# Patient Record
Sex: Female | Born: 1940 | Hispanic: No | Smoking: Never smoker
Health system: Southern US, Community
[De-identification: ages and names within clinical notes are randomized; demographics above are authoritative.]

## PROBLEM LIST (undated history)

## (undated) DIAGNOSIS — I499 Cardiac arrhythmia, unspecified: Secondary | ICD-10-CM

## (undated) DIAGNOSIS — M1990 Unspecified osteoarthritis, unspecified site: Secondary | ICD-10-CM

## (undated) DIAGNOSIS — I4891 Unspecified atrial fibrillation: Secondary | ICD-10-CM

## (undated) DIAGNOSIS — R51 Headache: Secondary | ICD-10-CM

## (undated) HISTORY — DX: Unspecified atrial fibrillation: I48.91

## (undated) HISTORY — DX: Headache: R51

## (undated) HISTORY — PX: CATARACT EXTRACTION, BILATERAL: SHX1313

## (undated) HISTORY — PX: BREAST BIOPSY: SHX20

## (undated) HISTORY — PX: OTHER SURGICAL HISTORY: SHX169

---

## 1997-06-27 ENCOUNTER — Other Ambulatory Visit: Admission: RE | Admit: 1997-06-27 | Discharge: 1997-06-27 | Payer: Self-pay | Admitting: *Deleted

## 1998-07-23 ENCOUNTER — Other Ambulatory Visit: Admission: RE | Admit: 1998-07-23 | Discharge: 1998-07-23 | Payer: Self-pay | Admitting: *Deleted

## 1999-07-30 ENCOUNTER — Other Ambulatory Visit: Admission: RE | Admit: 1999-07-30 | Discharge: 1999-07-30 | Payer: Self-pay | Admitting: *Deleted

## 2000-08-07 ENCOUNTER — Other Ambulatory Visit: Admission: RE | Admit: 2000-08-07 | Discharge: 2000-08-07 | Payer: Self-pay | Admitting: *Deleted

## 2005-02-10 ENCOUNTER — Ambulatory Visit: Payer: Self-pay | Admitting: Internal Medicine

## 2005-02-10 ENCOUNTER — Ambulatory Visit (HOSPITAL_COMMUNITY): Admission: RE | Admit: 2005-02-10 | Discharge: 2005-02-10 | Payer: Self-pay | Admitting: Internal Medicine

## 2006-01-27 ENCOUNTER — Encounter: Admission: RE | Admit: 2006-01-27 | Discharge: 2006-01-27 | Payer: Self-pay | Admitting: Internal Medicine

## 2007-01-29 ENCOUNTER — Encounter: Admission: RE | Admit: 2007-01-29 | Discharge: 2007-01-29 | Payer: Self-pay | Admitting: Internal Medicine

## 2008-02-12 ENCOUNTER — Encounter: Admission: RE | Admit: 2008-02-12 | Discharge: 2008-02-12 | Payer: Self-pay | Admitting: Unknown Physician Specialty

## 2010-02-11 ENCOUNTER — Ambulatory Visit: Admit: 2010-02-11 | Payer: Self-pay | Admitting: Gastroenterology

## 2010-02-23 ENCOUNTER — Encounter (INDEPENDENT_AMBULATORY_CARE_PROVIDER_SITE_OTHER): Payer: Self-pay

## 2010-03-18 NOTE — Letter (Signed)
Summary: Recall, Screening Colonoscopy Only  [facility]  [address]   [place], [place] [address]   Phone: [phone]  Fax: [phone]    February 23, 2010  SEPHIRA ZELLMAN [address] [place], [place]  [address] 01/29/1941   Dear Ms. Mayford Knife,   Our records indicate it is time to schedule your colonoscopy.   Please call our office at [phone] and ask for the nurse.   Thank you,  Hendricks Limes, LPN Cloria Spring, LPN  [facility] Ph: [phone]   Fax: [phone]

## 2010-07-02 NOTE — Op Note (Signed)
NAMEKARIS, RILLING                  ACCOUNT NO.:  [id number]   MEDICAL RECORD NO.:  [id number]          PATIENT TYPE:  AMB   LOCATION:  DAY                           FACILITY:  [facility]   PHYSICIAN:  Lionel December, M.D.    DATE OF BIRTH:  1941-02-04   DATE OF PROCEDURE:  02/10/2005  DATE OF DISCHARGE:                                 OPERATIVE REPORT   PROCEDURE:  Colonoscopy.   INDICATION:  Sherry Smith is a 70 year old Caucasian female who is here for high-risk  screening colonoscopy. Her mother was treated for colon carcinoma in her 59s  and remains in remission. Procedure risks were reviewed with the patient,  informed consent was obtained.   MEDICINES FOR CONSCIOUS SEDATION:  Demerol 25 mg IV, Versed 5 mg IV.   FINDINGS:  Procedure performed in endoscopy suite. The patient's vital signs  and O2 sat were monitored during procedure and remained stable. The patient  was placed in left lateral recumbent position and rectal examination  performed. No abnormality noted on external or digital exam. Olympus  videoscope was placed in rectum and advanced under vision into sigmoid colon  and beyond. Preparation was excellent. Somewhat redundant colon but the  scope was passed into cecum which was identified by appendiceal orifice and  ileocecal valve. Pictures taken for the record. As the scope was withdrawn,  colonic mucosa was carefully examined. Some areas revealed fine punctate  pigmentation consistent with melanosis coli. No polyps and/or tumor masses  were noted. Rectal mucosa similarly was normal. Scope was retroflexed to  examine anorectal junction and moderate-sized hemorrhoids were noted below  the dentate line. Endoscope was straightened and withdrawn. The patient  tolerated the procedure well.   FINAL DIAGNOSIS:  External hemorrhoids, otherwise normal colonoscopy.   RECOMMENDATIONS:  1.  Yearly Hemoccults.  2.  High-fiber diet.  3.  She may consider next screening exam in 5 years from  now.      Lionel December, M.D.  Electronically Signed     NR/MEDQ  D:  02/10/2005  T:  02/10/2005  Job:  [id number]   cc:   Ernestina Penna  Fax: [phone]   Dhruv Vyas  Fax: [phone]

## 2012-01-09 ENCOUNTER — Encounter (INDEPENDENT_AMBULATORY_CARE_PROVIDER_SITE_OTHER): Payer: Self-pay | Admitting: *Deleted

## 2015-05-04 DIAGNOSIS — Z1231 Encounter for screening mammogram for malignant neoplasm of breast: Secondary | ICD-10-CM | POA: Diagnosis not present

## 2015-05-07 DIAGNOSIS — H538 Other visual disturbances: Secondary | ICD-10-CM | POA: Diagnosis not present

## 2015-05-07 DIAGNOSIS — H2513 Age-related nuclear cataract, bilateral: Secondary | ICD-10-CM | POA: Diagnosis not present

## 2015-05-28 DIAGNOSIS — Z0000 Encounter for general adult medical examination without abnormal findings: Secondary | ICD-10-CM | POA: Diagnosis not present

## 2015-05-28 DIAGNOSIS — E7800 Pure hypercholesterolemia, unspecified: Secondary | ICD-10-CM | POA: Diagnosis not present

## 2015-05-28 DIAGNOSIS — Z7189 Other specified counseling: Secondary | ICD-10-CM | POA: Diagnosis not present

## 2015-05-28 DIAGNOSIS — Z299 Encounter for prophylactic measures, unspecified: Secondary | ICD-10-CM | POA: Diagnosis not present

## 2015-05-28 DIAGNOSIS — Z1389 Encounter for screening for other disorder: Secondary | ICD-10-CM | POA: Diagnosis not present

## 2015-05-28 DIAGNOSIS — Z6824 Body mass index (BMI) 24.0-24.9, adult: Secondary | ICD-10-CM | POA: Diagnosis not present

## 2015-05-28 DIAGNOSIS — Z1211 Encounter for screening for malignant neoplasm of colon: Secondary | ICD-10-CM | POA: Diagnosis not present

## 2015-05-28 DIAGNOSIS — R5383 Other fatigue: Secondary | ICD-10-CM | POA: Diagnosis not present

## 2015-05-28 DIAGNOSIS — Z79899 Other long term (current) drug therapy: Secondary | ICD-10-CM | POA: Diagnosis not present

## 2015-12-02 DIAGNOSIS — Z6824 Body mass index (BMI) 24.0-24.9, adult: Secondary | ICD-10-CM | POA: Diagnosis not present

## 2015-12-02 DIAGNOSIS — Z01419 Encounter for gynecological examination (general) (routine) without abnormal findings: Secondary | ICD-10-CM | POA: Diagnosis not present

## 2015-12-02 DIAGNOSIS — G43009 Migraine without aura, not intractable, without status migrainosus: Secondary | ICD-10-CM | POA: Diagnosis not present

## 2015-12-14 DIAGNOSIS — Z713 Dietary counseling and surveillance: Secondary | ICD-10-CM | POA: Diagnosis not present

## 2015-12-14 DIAGNOSIS — M1990 Unspecified osteoarthritis, unspecified site: Secondary | ICD-10-CM | POA: Diagnosis not present

## 2015-12-14 DIAGNOSIS — Z299 Encounter for prophylactic measures, unspecified: Secondary | ICD-10-CM | POA: Diagnosis not present

## 2015-12-14 DIAGNOSIS — Z6824 Body mass index (BMI) 24.0-24.9, adult: Secondary | ICD-10-CM | POA: Diagnosis not present

## 2015-12-28 DIAGNOSIS — Z23 Encounter for immunization: Secondary | ICD-10-CM | POA: Diagnosis not present

## 2016-04-01 DIAGNOSIS — Z6823 Body mass index (BMI) 23.0-23.9, adult: Secondary | ICD-10-CM | POA: Diagnosis not present

## 2016-04-01 DIAGNOSIS — R300 Dysuria: Secondary | ICD-10-CM | POA: Diagnosis not present

## 2016-04-01 DIAGNOSIS — N390 Urinary tract infection, site not specified: Secondary | ICD-10-CM | POA: Diagnosis not present

## 2016-04-01 DIAGNOSIS — Z789 Other specified health status: Secondary | ICD-10-CM | POA: Diagnosis not present

## 2016-04-01 DIAGNOSIS — Z713 Dietary counseling and surveillance: Secondary | ICD-10-CM | POA: Diagnosis not present

## 2016-04-01 DIAGNOSIS — Z299 Encounter for prophylactic measures, unspecified: Secondary | ICD-10-CM | POA: Diagnosis not present

## 2016-05-05 DIAGNOSIS — Z1231 Encounter for screening mammogram for malignant neoplasm of breast: Secondary | ICD-10-CM | POA: Diagnosis not present

## 2016-05-10 DIAGNOSIS — D485 Neoplasm of uncertain behavior of skin: Secondary | ICD-10-CM | POA: Diagnosis not present

## 2016-05-10 DIAGNOSIS — L821 Other seborrheic keratosis: Secondary | ICD-10-CM | POA: Diagnosis not present

## 2016-05-10 DIAGNOSIS — L820 Inflamed seborrheic keratosis: Secondary | ICD-10-CM | POA: Diagnosis not present

## 2016-05-10 DIAGNOSIS — L570 Actinic keratosis: Secondary | ICD-10-CM | POA: Diagnosis not present

## 2016-05-16 DIAGNOSIS — N390 Urinary tract infection, site not specified: Secondary | ICD-10-CM | POA: Diagnosis not present

## 2016-05-16 DIAGNOSIS — R109 Unspecified abdominal pain: Secondary | ICD-10-CM | POA: Diagnosis not present

## 2016-05-16 DIAGNOSIS — Z6824 Body mass index (BMI) 24.0-24.9, adult: Secondary | ICD-10-CM | POA: Diagnosis not present

## 2016-05-16 DIAGNOSIS — Z299 Encounter for prophylactic measures, unspecified: Secondary | ICD-10-CM | POA: Diagnosis not present

## 2016-05-16 DIAGNOSIS — Z713 Dietary counseling and surveillance: Secondary | ICD-10-CM | POA: Diagnosis not present

## 2016-06-02 DIAGNOSIS — Z1211 Encounter for screening for malignant neoplasm of colon: Secondary | ICD-10-CM | POA: Diagnosis not present

## 2016-06-02 DIAGNOSIS — R32 Unspecified urinary incontinence: Secondary | ICD-10-CM | POA: Diagnosis not present

## 2016-06-02 DIAGNOSIS — R5383 Other fatigue: Secondary | ICD-10-CM | POA: Diagnosis not present

## 2016-06-02 DIAGNOSIS — Z7189 Other specified counseling: Secondary | ICD-10-CM | POA: Diagnosis not present

## 2016-06-02 DIAGNOSIS — Z0000 Encounter for general adult medical examination without abnormal findings: Secondary | ICD-10-CM | POA: Diagnosis not present

## 2016-06-02 DIAGNOSIS — Z6824 Body mass index (BMI) 24.0-24.9, adult: Secondary | ICD-10-CM | POA: Diagnosis not present

## 2016-06-02 DIAGNOSIS — Z299 Encounter for prophylactic measures, unspecified: Secondary | ICD-10-CM | POA: Diagnosis not present

## 2016-06-02 DIAGNOSIS — E7800 Pure hypercholesterolemia, unspecified: Secondary | ICD-10-CM | POA: Diagnosis not present

## 2016-06-02 DIAGNOSIS — M8580 Other specified disorders of bone density and structure, unspecified site: Secondary | ICD-10-CM | POA: Diagnosis not present

## 2016-06-02 DIAGNOSIS — Z1389 Encounter for screening for other disorder: Secondary | ICD-10-CM | POA: Diagnosis not present

## 2016-06-02 DIAGNOSIS — Z79899 Other long term (current) drug therapy: Secondary | ICD-10-CM | POA: Diagnosis not present

## 2016-07-15 DIAGNOSIS — H52223 Regular astigmatism, bilateral: Secondary | ICD-10-CM | POA: Diagnosis not present

## 2016-07-15 DIAGNOSIS — H524 Presbyopia: Secondary | ICD-10-CM | POA: Diagnosis not present

## 2016-07-15 DIAGNOSIS — H2513 Age-related nuclear cataract, bilateral: Secondary | ICD-10-CM | POA: Diagnosis not present

## 2016-07-15 DIAGNOSIS — H5203 Hypermetropia, bilateral: Secondary | ICD-10-CM | POA: Diagnosis not present

## 2016-07-20 DIAGNOSIS — Z6824 Body mass index (BMI) 24.0-24.9, adult: Secondary | ICD-10-CM | POA: Diagnosis not present

## 2016-07-20 DIAGNOSIS — M1990 Unspecified osteoarthritis, unspecified site: Secondary | ICD-10-CM | POA: Diagnosis not present

## 2016-07-20 DIAGNOSIS — E7800 Pure hypercholesterolemia, unspecified: Secondary | ICD-10-CM | POA: Diagnosis not present

## 2016-07-20 DIAGNOSIS — R32 Unspecified urinary incontinence: Secondary | ICD-10-CM | POA: Diagnosis not present

## 2016-07-20 DIAGNOSIS — Z299 Encounter for prophylactic measures, unspecified: Secondary | ICD-10-CM | POA: Diagnosis not present

## 2016-10-20 DIAGNOSIS — R350 Frequency of micturition: Secondary | ICD-10-CM | POA: Diagnosis not present

## 2016-10-20 DIAGNOSIS — Z6824 Body mass index (BMI) 24.0-24.9, adult: Secondary | ICD-10-CM | POA: Diagnosis not present

## 2016-10-20 DIAGNOSIS — E7800 Pure hypercholesterolemia, unspecified: Secondary | ICD-10-CM | POA: Diagnosis not present

## 2016-10-20 DIAGNOSIS — Z299 Encounter for prophylactic measures, unspecified: Secondary | ICD-10-CM | POA: Diagnosis not present

## 2016-10-20 DIAGNOSIS — Z713 Dietary counseling and surveillance: Secondary | ICD-10-CM | POA: Diagnosis not present

## 2016-11-18 DIAGNOSIS — M8580 Other specified disorders of bone density and structure, unspecified site: Secondary | ICD-10-CM | POA: Diagnosis not present

## 2016-11-18 DIAGNOSIS — Z6824 Body mass index (BMI) 24.0-24.9, adult: Secondary | ICD-10-CM | POA: Diagnosis not present

## 2016-11-18 DIAGNOSIS — R1310 Dysphagia, unspecified: Secondary | ICD-10-CM | POA: Diagnosis not present

## 2016-11-18 DIAGNOSIS — Z299 Encounter for prophylactic measures, unspecified: Secondary | ICD-10-CM | POA: Diagnosis not present

## 2016-11-18 DIAGNOSIS — E7800 Pure hypercholesterolemia, unspecified: Secondary | ICD-10-CM | POA: Diagnosis not present

## 2016-11-23 ENCOUNTER — Encounter (INDEPENDENT_AMBULATORY_CARE_PROVIDER_SITE_OTHER): Payer: Self-pay | Admitting: Internal Medicine

## 2016-11-29 ENCOUNTER — Encounter (INDEPENDENT_AMBULATORY_CARE_PROVIDER_SITE_OTHER): Payer: Self-pay

## 2016-11-29 ENCOUNTER — Encounter (INDEPENDENT_AMBULATORY_CARE_PROVIDER_SITE_OTHER): Payer: Self-pay | Admitting: Internal Medicine

## 2016-11-29 ENCOUNTER — Ambulatory Visit (INDEPENDENT_AMBULATORY_CARE_PROVIDER_SITE_OTHER): Payer: Medicare Other | Admitting: Internal Medicine

## 2016-11-29 ENCOUNTER — Encounter (INDEPENDENT_AMBULATORY_CARE_PROVIDER_SITE_OTHER): Payer: Self-pay | Admitting: *Deleted

## 2016-11-29 VITALS — BP 150/72 | HR 64 | Temp 98.2°F | Ht 65.0 in | Wt 139.1 lb

## 2016-11-29 DIAGNOSIS — R51 Headache: Secondary | ICD-10-CM

## 2016-11-29 DIAGNOSIS — R519 Headache, unspecified: Secondary | ICD-10-CM

## 2016-11-29 DIAGNOSIS — G8929 Other chronic pain: Secondary | ICD-10-CM

## 2016-11-29 DIAGNOSIS — R1319 Other dysphagia: Secondary | ICD-10-CM

## 2016-11-29 DIAGNOSIS — R1310 Dysphagia, unspecified: Secondary | ICD-10-CM

## 2016-11-29 HISTORY — DX: Headache, unspecified: R51.9

## 2016-11-29 HISTORY — DX: Other chronic pain: G89.29

## 2016-11-29 NOTE — Patient Instructions (Signed)
DG esophagram.   

## 2016-11-29 NOTE — Progress Notes (Signed)
   Subjective:    Patient ID: Sherry Smith, female    DOB: Jun 29, 1940, 76 y.o.   MRN: [id number]  HPI  Referred by Dr. Woody Seller for dysphagia. Presents today with c/o dysphagia. She has had 4 episodes of dysphagia.  There was no pain. She was unable to swallow her saliva. Last episode was in October.   Her appetite is good. No weight loss.  She has never had an EGD/'ED in the past.  She had a colonoscopy about 10 yrs ago by kDR. Rehman and it was nromal. 06/06/2016 H and H 13.7 and 38.9 Total bili 0.2, ALP 87. AST 16, ALT 9  Review of Systems Past Medical History:  Diagnosis Date  . Chronic headaches 11/29/2016    Past Surgical History:  Procedure Laterality Date  . Ruptured ovary     left ovary in the 60s    Allergies  Allergen Reactions  . Sulfa Antibiotics     Headaches.  . Tetracyclines & Related     Yeast infection    No current outpatient prescriptions on file prior to visit.   No current facility-administered medications on file prior to visit.    Current Outpatient Prescriptions  Medication Sig Dispense Refill  . butalbital-acetaminophen-caffeine (FIORICET, ESGIC) 50-325-40 MG tablet Take by mouth 4 (four) times daily.    . celecoxib (CELEBREX) 100 MG capsule Take 100 mg by mouth as needed.    . pantoprazole (PROTONIX) 40 MG tablet Take by mouth daily.    . ranitidine (ZANTAC) 150 MG capsule Take 150 mg by mouth every morning.     No current facility-administered medications for this visit.         Objective:   Physical Exam Blood pressure (!) 150/72, pulse 64, temperature 98.2 F (36.8 C), height 5\' 5"  (1.651 m), weight 139 lb 1.6 oz (63.1 kg). Alert and oriented. Skin warm and dry. Oral mucosa is moist.   . Sclera anicteric, conjunctivae is pink. Thyroid not enlarged. No cervical lymphadenopathy. Lungs clear. Heart regular rate and rhythm.  Abdomen is soft. Bowel sounds are positive. No hepatomegaly. No abdominal masses felt. No tenderness.  No edema to lower  extremities.           Assessment & Plan:  Dysphagia. Am going to get an DG esophagram. Further recommendations to follow. Marland Kitchen

## 2016-11-30 DIAGNOSIS — R350 Frequency of micturition: Secondary | ICD-10-CM | POA: Diagnosis not present

## 2016-11-30 DIAGNOSIS — N390 Urinary tract infection, site not specified: Secondary | ICD-10-CM | POA: Diagnosis not present

## 2016-11-30 DIAGNOSIS — Z6824 Body mass index (BMI) 24.0-24.9, adult: Secondary | ICD-10-CM | POA: Diagnosis not present

## 2016-11-30 DIAGNOSIS — M545 Low back pain: Secondary | ICD-10-CM | POA: Diagnosis not present

## 2016-11-30 DIAGNOSIS — Z299 Encounter for prophylactic measures, unspecified: Secondary | ICD-10-CM | POA: Diagnosis not present

## 2016-12-05 ENCOUNTER — Encounter (HOSPITAL_COMMUNITY): Payer: Self-pay | Admitting: Radiology

## 2016-12-05 ENCOUNTER — Ambulatory Visit (HOSPITAL_COMMUNITY)
Admission: RE | Admit: 2016-12-05 | Discharge: 2016-12-05 | Disposition: A | Discharge status: Home / Self Care | Payer: Medicare Other | Source: Ambulatory Visit | Attending: Internal Medicine | Admitting: Internal Medicine

## 2016-12-05 DIAGNOSIS — T17308A Unspecified foreign body in larynx causing other injury, initial encounter: Secondary | ICD-10-CM | POA: Diagnosis not present

## 2016-12-05 DIAGNOSIS — R1310 Dysphagia, unspecified: Secondary | ICD-10-CM | POA: Insufficient documentation

## 2016-12-05 DIAGNOSIS — R1319 Other dysphagia: Secondary | ICD-10-CM

## 2016-12-13 DIAGNOSIS — Z23 Encounter for immunization: Secondary | ICD-10-CM | POA: Diagnosis not present

## 2016-12-26 ENCOUNTER — Encounter (INDEPENDENT_AMBULATORY_CARE_PROVIDER_SITE_OTHER): Payer: Self-pay | Admitting: Internal Medicine

## 2016-12-26 NOTE — Progress Notes (Signed)
Patient was given an appointment for 02/27/17 at 2:15pm.  A letter was mailed to the patient.

## 2017-02-27 ENCOUNTER — Ambulatory Visit (INDEPENDENT_AMBULATORY_CARE_PROVIDER_SITE_OTHER): Payer: Medicare Other | Admitting: Internal Medicine

## 2017-03-08 ENCOUNTER — Ambulatory Visit (INDEPENDENT_AMBULATORY_CARE_PROVIDER_SITE_OTHER): Payer: Medicare Other | Admitting: Internal Medicine

## 2017-03-15 DIAGNOSIS — Z6825 Body mass index (BMI) 25.0-25.9, adult: Secondary | ICD-10-CM | POA: Diagnosis not present

## 2017-03-15 DIAGNOSIS — Z299 Encounter for prophylactic measures, unspecified: Secondary | ICD-10-CM | POA: Diagnosis not present

## 2017-03-15 DIAGNOSIS — Z713 Dietary counseling and surveillance: Secondary | ICD-10-CM | POA: Diagnosis not present

## 2017-03-15 DIAGNOSIS — J029 Acute pharyngitis, unspecified: Secondary | ICD-10-CM | POA: Diagnosis not present

## 2017-03-15 DIAGNOSIS — Z789 Other specified health status: Secondary | ICD-10-CM | POA: Diagnosis not present

## 2017-03-16 ENCOUNTER — Ambulatory Visit (INDEPENDENT_AMBULATORY_CARE_PROVIDER_SITE_OTHER): Payer: Medicare Other | Admitting: Internal Medicine

## 2017-03-29 ENCOUNTER — Ambulatory Visit (INDEPENDENT_AMBULATORY_CARE_PROVIDER_SITE_OTHER): Payer: Medicare Other | Admitting: Internal Medicine

## 2017-03-29 ENCOUNTER — Encounter (INDEPENDENT_AMBULATORY_CARE_PROVIDER_SITE_OTHER): Payer: Self-pay | Admitting: Internal Medicine

## 2017-03-29 VITALS — BP 130/80 | HR 60 | Temp 98.0°F | Ht 63.0 in | Wt 137.2 lb

## 2017-03-29 DIAGNOSIS — R1319 Other dysphagia: Secondary | ICD-10-CM

## 2017-03-29 DIAGNOSIS — R1310 Dysphagia, unspecified: Secondary | ICD-10-CM | POA: Diagnosis not present

## 2017-03-29 NOTE — Progress Notes (Signed)
   Subjective:    Patient ID: Sherry Smith, female    DOB: Jun 02, 1940, 77 y.o.   MRN: [id number]   PCP Dr. Woody Seller.   Last seen in October of 2018. Wt in October 139. Ht 65 HPI Here today for f/u.  Hx of dysphagia. At [place] she had had 4 episodes of dysphagia.  She was unable to swallow her saliva. 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 Otherwise unremarkable exam. She is trying to chew slowly. She was having trouble chicken breast. She is chewing her food well.  She says she has not had an episode since [place]. Her appetite is good. She has lost about 1 pound since her last visit. Her BMs are normal. No melena or BRRB.     Review of Systems     Objective:   Physical Exam Blood pressure 130/80, pulse 60, temperature 98 F (36.7 C), height 5\' 3"  (1.6 m), weight 137 lb 3.2 oz (62.2 kg). Alert and oriented. Skin warm and dry. Oral mucosa is moist.   . Sclera anicteric, conjunctivae is pink. Thyroid not enlarged. No cervical lymphadenopathy. Lungs clear. Heart regular rate and rhythm.  Abdomen is soft. Bowel sounds are positive. No hepatomegaly. No abdominal masses felt. No tenderness.  No edema to lower extremities.           Assessment & Plan:  Dysphagia. She is doing well. Has not had an episode of dysphagia since October 2018. DG esophagram was normal. OV in 1 year.

## 2017-03-29 NOTE — Patient Instructions (Signed)
OV in 1 year.  

## 2017-05-08 DIAGNOSIS — Z6825 Body mass index (BMI) 25.0-25.9, adult: Secondary | ICD-10-CM | POA: Diagnosis not present

## 2017-05-08 DIAGNOSIS — Z713 Dietary counseling and surveillance: Secondary | ICD-10-CM | POA: Diagnosis not present

## 2017-05-08 DIAGNOSIS — Z299 Encounter for prophylactic measures, unspecified: Secondary | ICD-10-CM | POA: Diagnosis not present

## 2017-05-08 DIAGNOSIS — J329 Chronic sinusitis, unspecified: Secondary | ICD-10-CM | POA: Diagnosis not present

## 2017-05-08 DIAGNOSIS — Z789 Other specified health status: Secondary | ICD-10-CM | POA: Diagnosis not present

## 2017-05-18 DIAGNOSIS — Z1231 Encounter for screening mammogram for malignant neoplasm of breast: Secondary | ICD-10-CM | POA: Diagnosis not present

## 2017-06-08 DIAGNOSIS — Z1339 Encounter for screening examination for other mental health and behavioral disorders: Secondary | ICD-10-CM | POA: Diagnosis not present

## 2017-06-08 DIAGNOSIS — Z1211 Encounter for screening for malignant neoplasm of colon: Secondary | ICD-10-CM | POA: Diagnosis not present

## 2017-06-08 DIAGNOSIS — Z1331 Encounter for screening for depression: Secondary | ICD-10-CM | POA: Diagnosis not present

## 2017-06-08 DIAGNOSIS — Z6824 Body mass index (BMI) 24.0-24.9, adult: Secondary | ICD-10-CM | POA: Diagnosis not present

## 2017-06-08 DIAGNOSIS — Z0000 Encounter for general adult medical examination without abnormal findings: Secondary | ICD-10-CM | POA: Diagnosis not present

## 2017-06-08 DIAGNOSIS — R1310 Dysphagia, unspecified: Secondary | ICD-10-CM | POA: Diagnosis not present

## 2017-06-08 DIAGNOSIS — Z299 Encounter for prophylactic measures, unspecified: Secondary | ICD-10-CM | POA: Diagnosis not present

## 2017-06-08 DIAGNOSIS — Z7189 Other specified counseling: Secondary | ICD-10-CM | POA: Diagnosis not present

## 2017-06-08 DIAGNOSIS — R5383 Other fatigue: Secondary | ICD-10-CM | POA: Diagnosis not present

## 2017-06-08 DIAGNOSIS — Z79899 Other long term (current) drug therapy: Secondary | ICD-10-CM | POA: Diagnosis not present

## 2017-06-08 DIAGNOSIS — M545 Low back pain: Secondary | ICD-10-CM | POA: Diagnosis not present

## 2017-06-26 DIAGNOSIS — R51 Headache: Secondary | ICD-10-CM | POA: Diagnosis not present

## 2017-06-26 DIAGNOSIS — Z6826 Body mass index (BMI) 26.0-26.9, adult: Secondary | ICD-10-CM | POA: Diagnosis not present

## 2017-11-30 DIAGNOSIS — Z23 Encounter for immunization: Secondary | ICD-10-CM | POA: Diagnosis not present

## 2017-12-04 DIAGNOSIS — Z6826 Body mass index (BMI) 26.0-26.9, adult: Secondary | ICD-10-CM | POA: Diagnosis not present

## 2017-12-04 DIAGNOSIS — Z01419 Encounter for gynecological examination (general) (routine) without abnormal findings: Secondary | ICD-10-CM | POA: Diagnosis not present

## 2017-12-07 DIAGNOSIS — Z299 Encounter for prophylactic measures, unspecified: Secondary | ICD-10-CM | POA: Diagnosis not present

## 2017-12-07 DIAGNOSIS — I10 Essential (primary) hypertension: Secondary | ICD-10-CM | POA: Diagnosis not present

## 2017-12-07 DIAGNOSIS — Z6825 Body mass index (BMI) 25.0-25.9, adult: Secondary | ICD-10-CM | POA: Diagnosis not present

## 2017-12-07 DIAGNOSIS — Z713 Dietary counseling and surveillance: Secondary | ICD-10-CM | POA: Diagnosis not present

## 2017-12-07 DIAGNOSIS — M159 Polyosteoarthritis, unspecified: Secondary | ICD-10-CM | POA: Diagnosis not present

## 2017-12-11 DIAGNOSIS — Z299 Encounter for prophylactic measures, unspecified: Secondary | ICD-10-CM | POA: Diagnosis not present

## 2017-12-11 DIAGNOSIS — M170 Bilateral primary osteoarthritis of knee: Secondary | ICD-10-CM | POA: Diagnosis not present

## 2017-12-11 DIAGNOSIS — G8929 Other chronic pain: Secondary | ICD-10-CM | POA: Diagnosis not present

## 2017-12-11 DIAGNOSIS — M25562 Pain in left knee: Secondary | ICD-10-CM | POA: Diagnosis not present

## 2017-12-11 DIAGNOSIS — M19041 Primary osteoarthritis, right hand: Secondary | ICD-10-CM | POA: Diagnosis not present

## 2017-12-11 DIAGNOSIS — I10 Essential (primary) hypertension: Secondary | ICD-10-CM | POA: Diagnosis not present

## 2017-12-11 DIAGNOSIS — Z6825 Body mass index (BMI) 25.0-25.9, adult: Secondary | ICD-10-CM | POA: Diagnosis not present

## 2017-12-11 DIAGNOSIS — M25462 Effusion, left knee: Secondary | ICD-10-CM | POA: Diagnosis not present

## 2017-12-11 DIAGNOSIS — M25561 Pain in right knee: Secondary | ICD-10-CM | POA: Diagnosis not present

## 2017-12-20 DIAGNOSIS — M25562 Pain in left knee: Secondary | ICD-10-CM | POA: Diagnosis not present

## 2017-12-20 DIAGNOSIS — M545 Low back pain: Secondary | ICD-10-CM | POA: Diagnosis not present

## 2017-12-26 DIAGNOSIS — M545 Low back pain: Secondary | ICD-10-CM | POA: Diagnosis not present

## 2017-12-26 DIAGNOSIS — M25562 Pain in left knee: Secondary | ICD-10-CM | POA: Diagnosis not present

## 2018-03-13 DIAGNOSIS — I10 Essential (primary) hypertension: Secondary | ICD-10-CM | POA: Diagnosis not present

## 2018-03-13 DIAGNOSIS — Z299 Encounter for prophylactic measures, unspecified: Secondary | ICD-10-CM | POA: Diagnosis not present

## 2018-03-13 DIAGNOSIS — Z6826 Body mass index (BMI) 26.0-26.9, adult: Secondary | ICD-10-CM | POA: Diagnosis not present

## 2018-03-13 DIAGNOSIS — Z789 Other specified health status: Secondary | ICD-10-CM | POA: Diagnosis not present

## 2018-03-13 DIAGNOSIS — R350 Frequency of micturition: Secondary | ICD-10-CM | POA: Diagnosis not present

## 2018-03-13 DIAGNOSIS — N390 Urinary tract infection, site not specified: Secondary | ICD-10-CM | POA: Diagnosis not present

## 2018-04-10 DIAGNOSIS — G8929 Other chronic pain: Secondary | ICD-10-CM | POA: Diagnosis not present

## 2018-04-10 DIAGNOSIS — Z6826 Body mass index (BMI) 26.0-26.9, adult: Secondary | ICD-10-CM | POA: Diagnosis not present

## 2018-04-10 DIAGNOSIS — M25562 Pain in left knee: Secondary | ICD-10-CM | POA: Diagnosis not present

## 2018-04-10 DIAGNOSIS — M1711 Unilateral primary osteoarthritis, right knee: Secondary | ICD-10-CM | POA: Diagnosis not present

## 2018-04-10 DIAGNOSIS — M25561 Pain in right knee: Secondary | ICD-10-CM | POA: Diagnosis not present

## 2018-04-10 DIAGNOSIS — Z299 Encounter for prophylactic measures, unspecified: Secondary | ICD-10-CM | POA: Diagnosis not present

## 2018-04-13 DIAGNOSIS — M1712 Unilateral primary osteoarthritis, left knee: Secondary | ICD-10-CM | POA: Diagnosis not present

## 2018-04-13 DIAGNOSIS — Z6826 Body mass index (BMI) 26.0-26.9, adult: Secondary | ICD-10-CM | POA: Diagnosis not present

## 2018-04-13 DIAGNOSIS — M25562 Pain in left knee: Secondary | ICD-10-CM | POA: Diagnosis not present

## 2018-04-13 DIAGNOSIS — G8929 Other chronic pain: Secondary | ICD-10-CM | POA: Diagnosis not present

## 2018-04-13 DIAGNOSIS — M25561 Pain in right knee: Secondary | ICD-10-CM | POA: Diagnosis not present

## 2018-04-13 DIAGNOSIS — Z299 Encounter for prophylactic measures, unspecified: Secondary | ICD-10-CM | POA: Diagnosis not present

## 2018-04-13 DIAGNOSIS — I10 Essential (primary) hypertension: Secondary | ICD-10-CM | POA: Diagnosis not present

## 2018-04-17 DIAGNOSIS — Z299 Encounter for prophylactic measures, unspecified: Secondary | ICD-10-CM | POA: Diagnosis not present

## 2018-04-17 DIAGNOSIS — I10 Essential (primary) hypertension: Secondary | ICD-10-CM | POA: Diagnosis not present

## 2018-04-17 DIAGNOSIS — M1712 Unilateral primary osteoarthritis, left knee: Secondary | ICD-10-CM | POA: Diagnosis not present

## 2018-04-17 DIAGNOSIS — Z6826 Body mass index (BMI) 26.0-26.9, adult: Secondary | ICD-10-CM | POA: Diagnosis not present

## 2018-04-19 DIAGNOSIS — M1712 Unilateral primary osteoarthritis, left knee: Secondary | ICD-10-CM | POA: Diagnosis not present

## 2018-04-19 DIAGNOSIS — I10 Essential (primary) hypertension: Secondary | ICD-10-CM | POA: Diagnosis not present

## 2018-04-19 DIAGNOSIS — Z299 Encounter for prophylactic measures, unspecified: Secondary | ICD-10-CM | POA: Diagnosis not present

## 2018-04-19 DIAGNOSIS — Z6826 Body mass index (BMI) 26.0-26.9, adult: Secondary | ICD-10-CM | POA: Diagnosis not present

## 2018-04-24 DIAGNOSIS — Z6826 Body mass index (BMI) 26.0-26.9, adult: Secondary | ICD-10-CM | POA: Diagnosis not present

## 2018-04-24 DIAGNOSIS — Z299 Encounter for prophylactic measures, unspecified: Secondary | ICD-10-CM | POA: Diagnosis not present

## 2018-04-24 DIAGNOSIS — M1711 Unilateral primary osteoarthritis, right knee: Secondary | ICD-10-CM | POA: Diagnosis not present

## 2018-04-27 DIAGNOSIS — I10 Essential (primary) hypertension: Secondary | ICD-10-CM | POA: Diagnosis not present

## 2018-04-27 DIAGNOSIS — Z6826 Body mass index (BMI) 26.0-26.9, adult: Secondary | ICD-10-CM | POA: Diagnosis not present

## 2018-04-27 DIAGNOSIS — M1712 Unilateral primary osteoarthritis, left knee: Secondary | ICD-10-CM | POA: Diagnosis not present

## 2018-04-27 DIAGNOSIS — Z299 Encounter for prophylactic measures, unspecified: Secondary | ICD-10-CM | POA: Diagnosis not present

## 2018-04-27 DIAGNOSIS — M1710 Unilateral primary osteoarthritis, unspecified knee: Secondary | ICD-10-CM | POA: Diagnosis not present

## 2018-05-01 DIAGNOSIS — M1711 Unilateral primary osteoarthritis, right knee: Secondary | ICD-10-CM | POA: Diagnosis not present

## 2018-05-01 DIAGNOSIS — Z6826 Body mass index (BMI) 26.0-26.9, adult: Secondary | ICD-10-CM | POA: Diagnosis not present

## 2018-05-01 DIAGNOSIS — Z299 Encounter for prophylactic measures, unspecified: Secondary | ICD-10-CM | POA: Diagnosis not present

## 2018-05-03 DIAGNOSIS — Z299 Encounter for prophylactic measures, unspecified: Secondary | ICD-10-CM | POA: Diagnosis not present

## 2018-05-03 DIAGNOSIS — M1712 Unilateral primary osteoarthritis, left knee: Secondary | ICD-10-CM | POA: Diagnosis not present

## 2018-05-03 DIAGNOSIS — Z6826 Body mass index (BMI) 26.0-26.9, adult: Secondary | ICD-10-CM | POA: Diagnosis not present

## 2018-05-03 DIAGNOSIS — I10 Essential (primary) hypertension: Secondary | ICD-10-CM | POA: Diagnosis not present

## 2018-05-08 DIAGNOSIS — M1711 Unilateral primary osteoarthritis, right knee: Secondary | ICD-10-CM | POA: Diagnosis not present

## 2018-05-08 DIAGNOSIS — Z299 Encounter for prophylactic measures, unspecified: Secondary | ICD-10-CM | POA: Diagnosis not present

## 2018-05-08 DIAGNOSIS — I10 Essential (primary) hypertension: Secondary | ICD-10-CM | POA: Diagnosis not present

## 2018-05-08 DIAGNOSIS — Z6826 Body mass index (BMI) 26.0-26.9, adult: Secondary | ICD-10-CM | POA: Diagnosis not present

## 2018-05-11 DIAGNOSIS — Z6826 Body mass index (BMI) 26.0-26.9, adult: Secondary | ICD-10-CM | POA: Diagnosis not present

## 2018-05-11 DIAGNOSIS — M1712 Unilateral primary osteoarthritis, left knee: Secondary | ICD-10-CM | POA: Diagnosis not present

## 2018-05-11 DIAGNOSIS — I10 Essential (primary) hypertension: Secondary | ICD-10-CM | POA: Diagnosis not present

## 2018-05-11 DIAGNOSIS — Z299 Encounter for prophylactic measures, unspecified: Secondary | ICD-10-CM | POA: Diagnosis not present

## 2018-05-15 DIAGNOSIS — Z299 Encounter for prophylactic measures, unspecified: Secondary | ICD-10-CM | POA: Diagnosis not present

## 2018-05-15 DIAGNOSIS — M1712 Unilateral primary osteoarthritis, left knee: Secondary | ICD-10-CM | POA: Diagnosis not present

## 2018-05-15 DIAGNOSIS — I10 Essential (primary) hypertension: Secondary | ICD-10-CM | POA: Diagnosis not present

## 2018-05-15 DIAGNOSIS — Z6826 Body mass index (BMI) 26.0-26.9, adult: Secondary | ICD-10-CM | POA: Diagnosis not present

## 2018-05-17 DIAGNOSIS — I10 Essential (primary) hypertension: Secondary | ICD-10-CM | POA: Diagnosis not present

## 2018-05-17 DIAGNOSIS — M1712 Unilateral primary osteoarthritis, left knee: Secondary | ICD-10-CM | POA: Diagnosis not present

## 2018-05-17 DIAGNOSIS — Z6826 Body mass index (BMI) 26.0-26.9, adult: Secondary | ICD-10-CM | POA: Diagnosis not present

## 2018-05-17 DIAGNOSIS — Z299 Encounter for prophylactic measures, unspecified: Secondary | ICD-10-CM | POA: Diagnosis not present

## 2018-05-19 DIAGNOSIS — I4891 Unspecified atrial fibrillation: Secondary | ICD-10-CM | POA: Diagnosis not present

## 2018-05-19 DIAGNOSIS — R001 Bradycardia, unspecified: Secondary | ICD-10-CM | POA: Diagnosis present

## 2018-05-19 DIAGNOSIS — R002 Palpitations: Secondary | ICD-10-CM | POA: Diagnosis not present

## 2018-05-19 DIAGNOSIS — I498 Other specified cardiac arrhythmias: Secondary | ICD-10-CM | POA: Diagnosis not present

## 2018-05-19 DIAGNOSIS — R009 Unspecified abnormalities of heart beat: Secondary | ICD-10-CM | POA: Diagnosis not present

## 2018-05-23 DIAGNOSIS — Z09 Encounter for follow-up examination after completed treatment for conditions other than malignant neoplasm: Secondary | ICD-10-CM | POA: Diagnosis not present

## 2018-05-23 DIAGNOSIS — I4891 Unspecified atrial fibrillation: Secondary | ICD-10-CM | POA: Diagnosis not present

## 2018-05-23 DIAGNOSIS — Z299 Encounter for prophylactic measures, unspecified: Secondary | ICD-10-CM | POA: Diagnosis not present

## 2018-05-23 DIAGNOSIS — I10 Essential (primary) hypertension: Secondary | ICD-10-CM | POA: Diagnosis not present

## 2018-05-23 DIAGNOSIS — Z6826 Body mass index (BMI) 26.0-26.9, adult: Secondary | ICD-10-CM | POA: Diagnosis not present

## 2018-06-11 DIAGNOSIS — Z6828 Body mass index (BMI) 28.0-28.9, adult: Secondary | ICD-10-CM | POA: Diagnosis not present

## 2018-06-11 DIAGNOSIS — I4891 Unspecified atrial fibrillation: Secondary | ICD-10-CM | POA: Diagnosis not present

## 2018-06-15 DIAGNOSIS — Z1331 Encounter for screening for depression: Secondary | ICD-10-CM | POA: Diagnosis not present

## 2018-06-15 DIAGNOSIS — R5383 Other fatigue: Secondary | ICD-10-CM | POA: Diagnosis not present

## 2018-06-15 DIAGNOSIS — Z0000 Encounter for general adult medical examination without abnormal findings: Secondary | ICD-10-CM | POA: Diagnosis not present

## 2018-06-15 DIAGNOSIS — Z79899 Other long term (current) drug therapy: Secondary | ICD-10-CM | POA: Diagnosis not present

## 2018-06-15 DIAGNOSIS — Z6826 Body mass index (BMI) 26.0-26.9, adult: Secondary | ICD-10-CM | POA: Diagnosis not present

## 2018-06-15 DIAGNOSIS — I10 Essential (primary) hypertension: Secondary | ICD-10-CM | POA: Diagnosis not present

## 2018-06-15 DIAGNOSIS — Z1339 Encounter for screening examination for other mental health and behavioral disorders: Secondary | ICD-10-CM | POA: Diagnosis not present

## 2018-06-15 DIAGNOSIS — Z299 Encounter for prophylactic measures, unspecified: Secondary | ICD-10-CM | POA: Diagnosis not present

## 2018-06-15 DIAGNOSIS — Z1211 Encounter for screening for malignant neoplasm of colon: Secondary | ICD-10-CM | POA: Diagnosis not present

## 2018-06-15 DIAGNOSIS — Z7189 Other specified counseling: Secondary | ICD-10-CM | POA: Diagnosis not present

## 2018-06-15 DIAGNOSIS — E7800 Pure hypercholesterolemia, unspecified: Secondary | ICD-10-CM | POA: Diagnosis not present

## 2018-06-19 DIAGNOSIS — R002 Palpitations: Secondary | ICD-10-CM | POA: Diagnosis not present

## 2018-06-19 DIAGNOSIS — Z882 Allergy status to sulfonamides status: Secondary | ICD-10-CM | POA: Diagnosis not present

## 2018-06-19 DIAGNOSIS — K449 Diaphragmatic hernia without obstruction or gangrene: Secondary | ICD-10-CM | POA: Diagnosis not present

## 2018-06-19 DIAGNOSIS — Z7901 Long term (current) use of anticoagulants: Secondary | ICD-10-CM | POA: Diagnosis not present

## 2018-06-19 DIAGNOSIS — I498 Other specified cardiac arrhythmias: Secondary | ICD-10-CM | POA: Diagnosis not present

## 2018-06-19 DIAGNOSIS — Z888 Allergy status to other drugs, medicaments and biological substances status: Secondary | ICD-10-CM | POA: Diagnosis not present

## 2018-06-21 DIAGNOSIS — I4891 Unspecified atrial fibrillation: Secondary | ICD-10-CM | POA: Diagnosis not present

## 2018-06-21 DIAGNOSIS — I358 Other nonrheumatic aortic valve disorders: Secondary | ICD-10-CM | POA: Diagnosis not present

## 2018-06-29 DIAGNOSIS — I4891 Unspecified atrial fibrillation: Secondary | ICD-10-CM | POA: Diagnosis not present

## 2018-07-16 DIAGNOSIS — Z1231 Encounter for screening mammogram for malignant neoplasm of breast: Secondary | ICD-10-CM | POA: Diagnosis not present

## 2018-07-20 DIAGNOSIS — I480 Paroxysmal atrial fibrillation: Secondary | ICD-10-CM | POA: Diagnosis not present

## 2018-07-20 DIAGNOSIS — R5383 Other fatigue: Secondary | ICD-10-CM | POA: Diagnosis not present

## 2018-07-20 DIAGNOSIS — I959 Hypotension, unspecified: Secondary | ICD-10-CM | POA: Diagnosis not present

## 2018-07-20 DIAGNOSIS — M7989 Other specified soft tissue disorders: Secondary | ICD-10-CM | POA: Diagnosis not present

## 2018-07-20 DIAGNOSIS — Z7901 Long term (current) use of anticoagulants: Secondary | ICD-10-CM | POA: Diagnosis not present

## 2018-07-30 DIAGNOSIS — I10 Essential (primary) hypertension: Secondary | ICD-10-CM | POA: Diagnosis not present

## 2018-07-30 DIAGNOSIS — R5383 Other fatigue: Secondary | ICD-10-CM | POA: Diagnosis not present

## 2018-07-30 DIAGNOSIS — I4891 Unspecified atrial fibrillation: Secondary | ICD-10-CM | POA: Diagnosis not present

## 2018-07-30 DIAGNOSIS — N390 Urinary tract infection, site not specified: Secondary | ICD-10-CM | POA: Diagnosis not present

## 2018-07-30 DIAGNOSIS — Z6826 Body mass index (BMI) 26.0-26.9, adult: Secondary | ICD-10-CM | POA: Diagnosis not present

## 2018-07-30 DIAGNOSIS — R3989 Other symptoms and signs involving the genitourinary system: Secondary | ICD-10-CM | POA: Diagnosis not present

## 2018-07-30 DIAGNOSIS — Z299 Encounter for prophylactic measures, unspecified: Secondary | ICD-10-CM | POA: Diagnosis not present

## 2018-07-30 DIAGNOSIS — R601 Generalized edema: Secondary | ICD-10-CM | POA: Diagnosis not present

## 2018-08-16 DIAGNOSIS — I10 Essential (primary) hypertension: Secondary | ICD-10-CM | POA: Diagnosis not present

## 2018-08-16 DIAGNOSIS — I4891 Unspecified atrial fibrillation: Secondary | ICD-10-CM | POA: Diagnosis not present

## 2018-08-16 DIAGNOSIS — Z713 Dietary counseling and surveillance: Secondary | ICD-10-CM | POA: Diagnosis not present

## 2018-08-16 DIAGNOSIS — Z6826 Body mass index (BMI) 26.0-26.9, adult: Secondary | ICD-10-CM | POA: Diagnosis not present

## 2018-08-16 DIAGNOSIS — Z299 Encounter for prophylactic measures, unspecified: Secondary | ICD-10-CM | POA: Diagnosis not present

## 2018-09-10 DIAGNOSIS — I10 Essential (primary) hypertension: Secondary | ICD-10-CM | POA: Diagnosis not present

## 2018-09-10 DIAGNOSIS — I4891 Unspecified atrial fibrillation: Secondary | ICD-10-CM | POA: Diagnosis not present

## 2018-09-10 DIAGNOSIS — H0013 Chalazion right eye, unspecified eyelid: Secondary | ICD-10-CM | POA: Diagnosis not present

## 2018-09-10 DIAGNOSIS — Z299 Encounter for prophylactic measures, unspecified: Secondary | ICD-10-CM | POA: Diagnosis not present

## 2018-09-10 DIAGNOSIS — Z6826 Body mass index (BMI) 26.0-26.9, adult: Secondary | ICD-10-CM | POA: Diagnosis not present

## 2018-09-11 DIAGNOSIS — H532 Diplopia: Secondary | ICD-10-CM | POA: Diagnosis not present

## 2018-09-11 DIAGNOSIS — H2513 Age-related nuclear cataract, bilateral: Secondary | ICD-10-CM | POA: Diagnosis not present

## 2018-09-11 DIAGNOSIS — H04123 Dry eye syndrome of bilateral lacrimal glands: Secondary | ICD-10-CM | POA: Diagnosis not present

## 2018-09-11 DIAGNOSIS — H00021 Hordeolum internum right upper eyelid: Secondary | ICD-10-CM | POA: Diagnosis not present

## 2018-09-18 DIAGNOSIS — H35363 Drusen (degenerative) of macula, bilateral: Secondary | ICD-10-CM | POA: Diagnosis not present

## 2018-09-18 DIAGNOSIS — H35013 Changes in retinal vascular appearance, bilateral: Secondary | ICD-10-CM | POA: Diagnosis not present

## 2018-09-18 DIAGNOSIS — H40023 Open angle with borderline findings, high risk, bilateral: Secondary | ICD-10-CM | POA: Diagnosis not present

## 2018-09-18 DIAGNOSIS — H2513 Age-related nuclear cataract, bilateral: Secondary | ICD-10-CM | POA: Diagnosis not present

## 2018-10-25 DIAGNOSIS — Z888 Allergy status to other drugs, medicaments and biological substances status: Secondary | ICD-10-CM | POA: Diagnosis not present

## 2018-10-25 DIAGNOSIS — I4891 Unspecified atrial fibrillation: Secondary | ICD-10-CM | POA: Diagnosis not present

## 2018-10-25 DIAGNOSIS — Z7901 Long term (current) use of anticoagulants: Secondary | ICD-10-CM | POA: Diagnosis not present

## 2018-10-25 DIAGNOSIS — Z882 Allergy status to sulfonamides status: Secondary | ICD-10-CM | POA: Diagnosis not present

## 2018-10-25 DIAGNOSIS — M1990 Unspecified osteoarthritis, unspecified site: Secondary | ICD-10-CM | POA: Diagnosis not present

## 2018-10-25 DIAGNOSIS — Z79899 Other long term (current) drug therapy: Secondary | ICD-10-CM | POA: Diagnosis not present

## 2018-10-25 DIAGNOSIS — R002 Palpitations: Secondary | ICD-10-CM | POA: Diagnosis not present

## 2018-12-05 DIAGNOSIS — H25042 Posterior subcapsular polar age-related cataract, left eye: Secondary | ICD-10-CM | POA: Diagnosis not present

## 2018-12-05 DIAGNOSIS — H25013 Cortical age-related cataract, bilateral: Secondary | ICD-10-CM | POA: Diagnosis not present

## 2018-12-05 DIAGNOSIS — H35363 Drusen (degenerative) of macula, bilateral: Secondary | ICD-10-CM | POA: Diagnosis not present

## 2018-12-05 DIAGNOSIS — H2511 Age-related nuclear cataract, right eye: Secondary | ICD-10-CM | POA: Diagnosis not present

## 2018-12-05 DIAGNOSIS — H2513 Age-related nuclear cataract, bilateral: Secondary | ICD-10-CM | POA: Diagnosis not present

## 2018-12-05 DIAGNOSIS — H401431 Capsular glaucoma with pseudoexfoliation of lens, bilateral, mild stage: Secondary | ICD-10-CM | POA: Diagnosis not present

## 2018-12-17 DIAGNOSIS — Z23 Encounter for immunization: Secondary | ICD-10-CM | POA: Diagnosis not present

## 2018-12-19 DIAGNOSIS — Z6826 Body mass index (BMI) 26.0-26.9, adult: Secondary | ICD-10-CM | POA: Diagnosis not present

## 2018-12-19 DIAGNOSIS — Z299 Encounter for prophylactic measures, unspecified: Secondary | ICD-10-CM | POA: Diagnosis not present

## 2018-12-19 DIAGNOSIS — I10 Essential (primary) hypertension: Secondary | ICD-10-CM | POA: Diagnosis not present

## 2018-12-19 DIAGNOSIS — Z713 Dietary counseling and surveillance: Secondary | ICD-10-CM | POA: Diagnosis not present

## 2018-12-19 DIAGNOSIS — I4891 Unspecified atrial fibrillation: Secondary | ICD-10-CM | POA: Diagnosis not present

## 2018-12-25 DIAGNOSIS — H2511 Age-related nuclear cataract, right eye: Secondary | ICD-10-CM | POA: Diagnosis not present

## 2018-12-25 DIAGNOSIS — H25811 Combined forms of age-related cataract, right eye: Secondary | ICD-10-CM | POA: Diagnosis not present

## 2018-12-30 IMAGING — RF DG ESOPHAGUS
10 series · 15 of 24 positions shown · non-contrast
Comparison: None

CLINICAL DATA: Esophageal dysphagia, 4 episodes of developing
hiccups followed by vomiting while eating

EXAM:
ESOPHOGRAM / BARIUM SWALLOW / BARIUM TABLET STUDY
TECHNIQUE: Combined double contrast and single contrast examination performed
using effervescent crystals, thick barium liquid, and thin barium
liquid. The patient was observed with fluoroscopy swallowing a 13 mm
barium sulphate tablet.
FLUOROSCOPY TIME:  Fluoroscopy Time:  1 minutes 30 seconds
Radiation Exposure Index (if provided by the fluoroscopic device):
12.0 mGy
Number of Acquired Spot Images: multiple fluoroscopic screen
captures

[Series 1: cp_standard · 0.18mm/px · 2 of 69 frames shown (1 of 10)]
[frame 11/69]
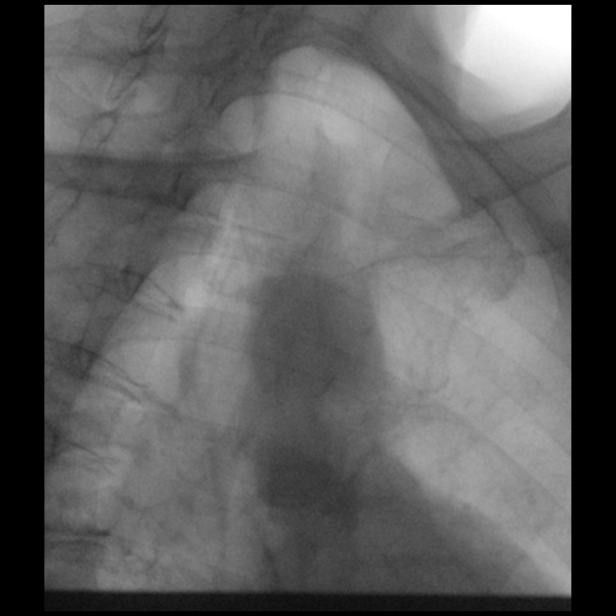
[frame 59/69]
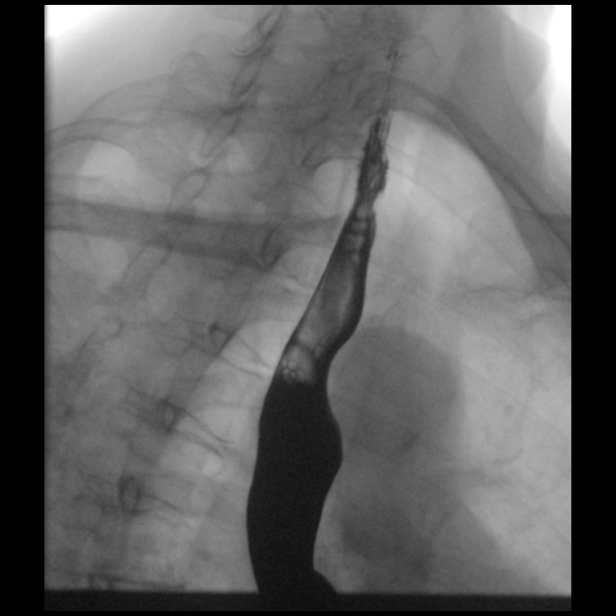

[Series 2: cp_standard · 0.18mm/px · 1 of 71 frames shown (2 of 10)]
[frame 61/71]
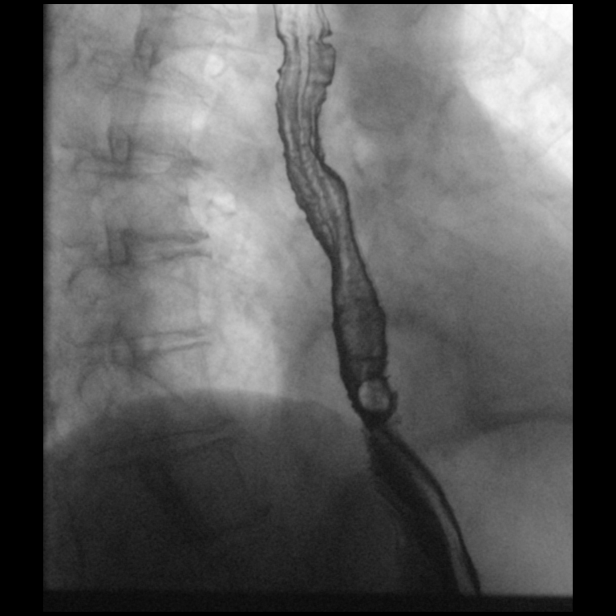

[Series 3: cp_standard · 0.18mm/px · 2 of 52 frames shown (3 of 10)]
[frame 8/52]
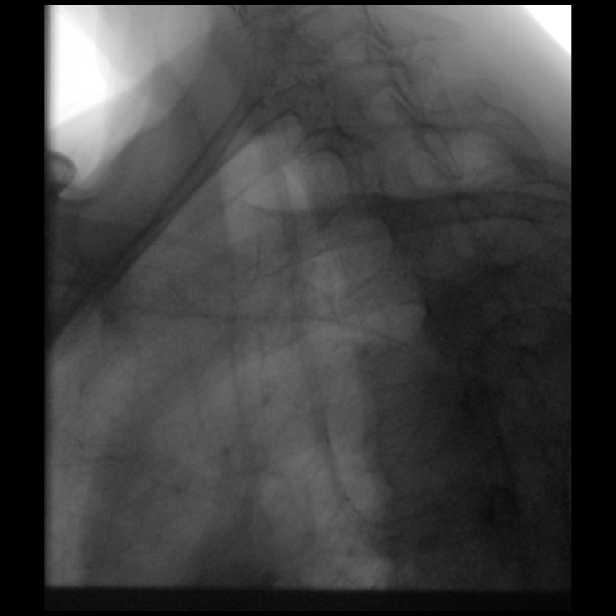
[frame 45/52]
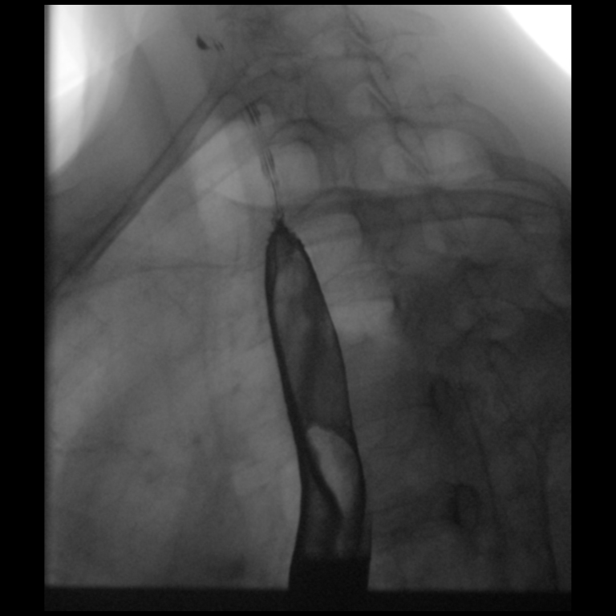

[Series 4: cp_standard · 0.18mm/px · 1 of 81 frames shown (4 of 10)]
[frame 13/81]
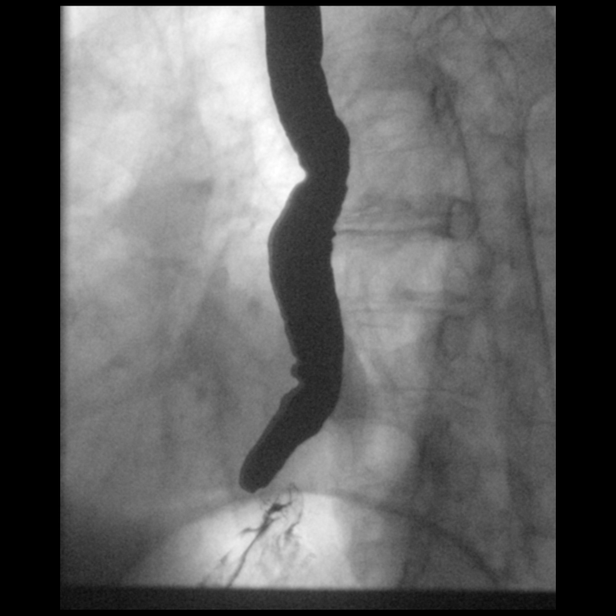

[Series 5: cp_standard · 0.29mm/px · 2 of 144 frames shown (5 of 10)]
[frame 22/144]
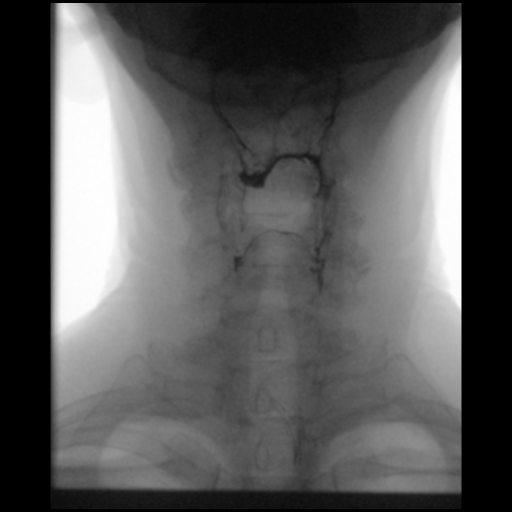
[frame 123/144]
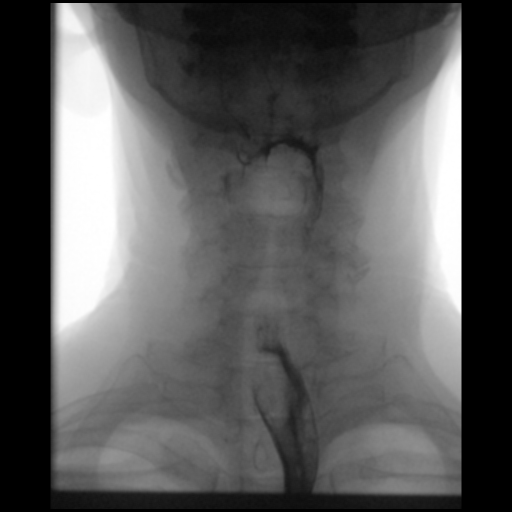

[Series 6: cp_standard · 0.28mm/px · 1 of 100 frames shown (6 of 10)]
[frame 51/100]
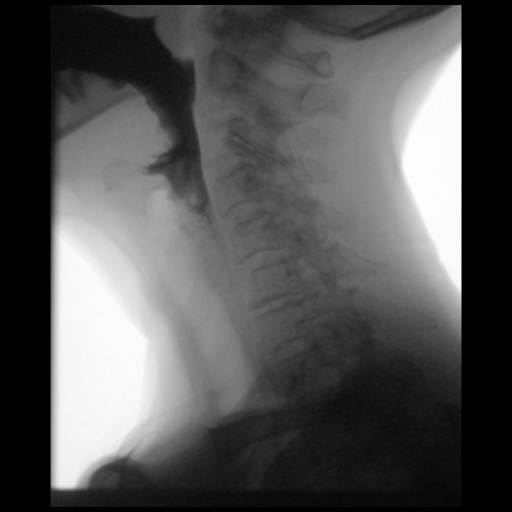

[Series 7: cp_standard · 0.19mm/px · 2 of 15 frames shown (7 of 10)]
[frame 8/15]
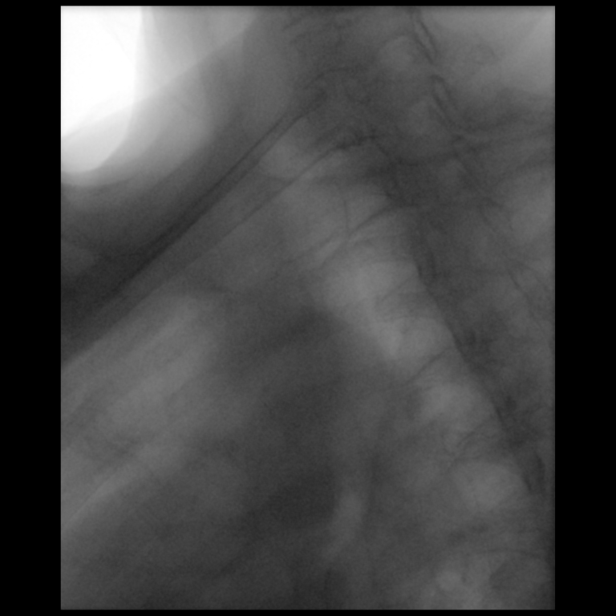
[frame 13/15]
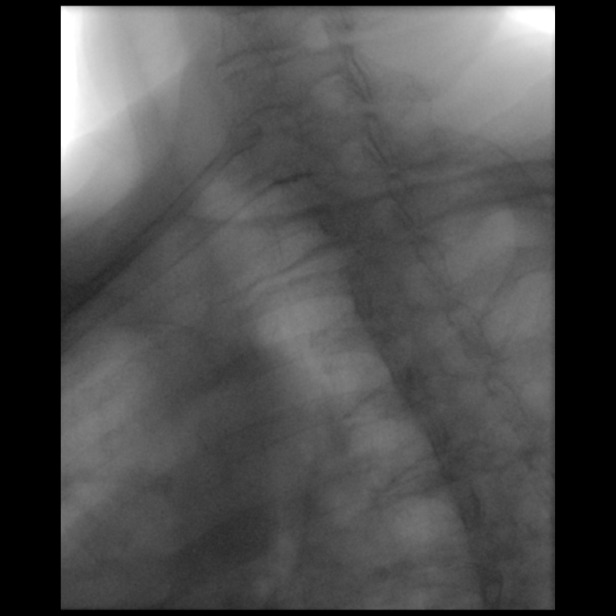

[Series 8: cp_standard · 0.19mm/px · 1 of 64 frames shown (8 of 10)]
[frame 33/64]
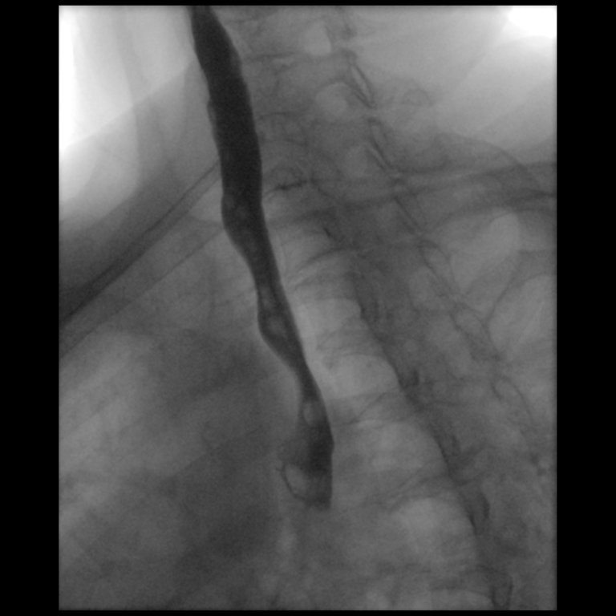

[Series 9: cp_standard · 0.19mm/px · 2 of 30 frames shown (9 of 10)]
[frame 9/30]
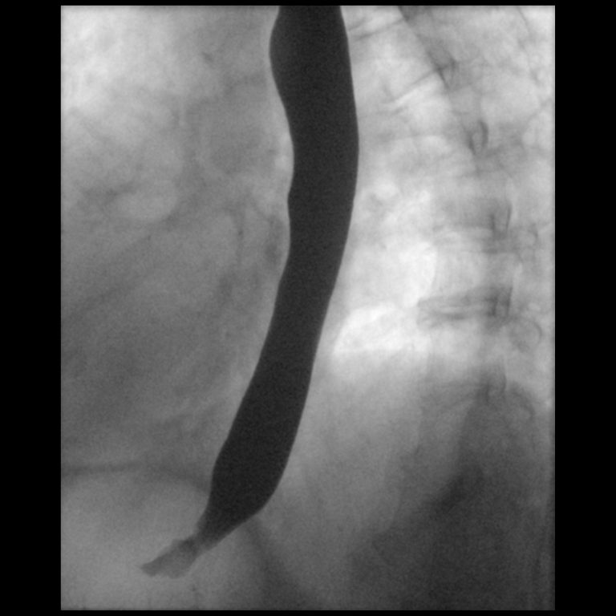
[frame 26/30]
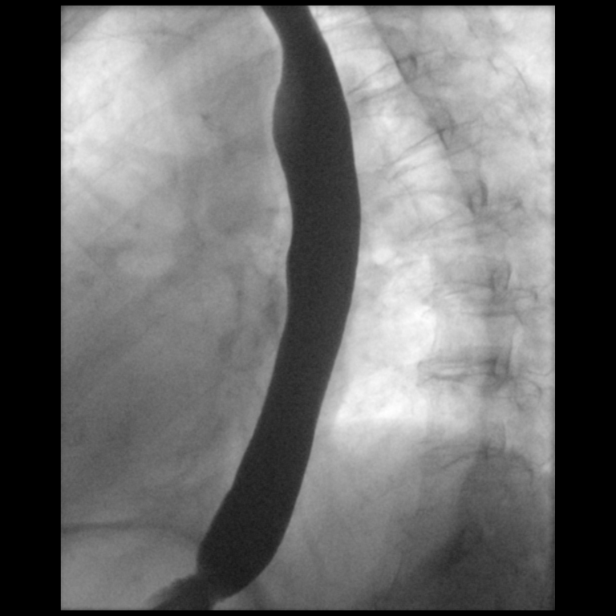

[Series 10: cp_standard · 0.19mm/px · 1 of 38 frames shown (10 of 10)]
[frame 33/38]
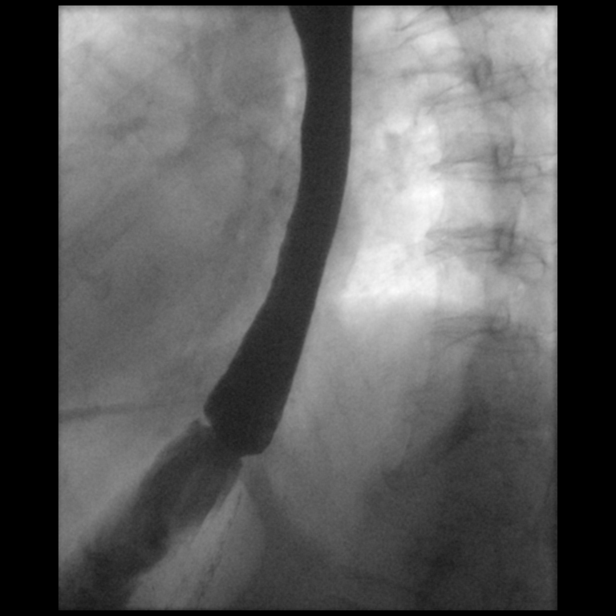

[15 of 24 positions shown; findings below may reference images not displayed]

FINDINGS: Esophageal distention: Normal distention without mass or stricture

Filling defects:  None

12.5 mm barium tablet: Passed from oral cavity to stomach without
obstruction

Motility:  Expected age-related dysmotility

Mucosa: Smooth appearance of mucosa without irregularity or
ulceration

Hypopharynx/cervical esophagus: Minimal laryngeal penetration
without aspiration

Hiatal hernia:  Absent

GE reflux:  Not visualized during exam

Other:  N/A
IMPRESSION: Expected age-related dysmotility.

Laryngeal penetration without aspiration.

Otherwise unremarkable exam.

## 2019-01-16 DIAGNOSIS — H25042 Posterior subcapsular polar age-related cataract, left eye: Secondary | ICD-10-CM | POA: Diagnosis not present

## 2019-01-16 DIAGNOSIS — H25012 Cortical age-related cataract, left eye: Secondary | ICD-10-CM | POA: Diagnosis not present

## 2019-01-16 DIAGNOSIS — H2589 Other age-related cataract: Secondary | ICD-10-CM | POA: Diagnosis not present

## 2019-01-16 DIAGNOSIS — H2512 Age-related nuclear cataract, left eye: Secondary | ICD-10-CM | POA: Diagnosis not present

## 2019-01-21 DIAGNOSIS — I10 Essential (primary) hypertension: Secondary | ICD-10-CM | POA: Insufficient documentation

## 2019-01-21 DIAGNOSIS — I480 Paroxysmal atrial fibrillation: Secondary | ICD-10-CM | POA: Insufficient documentation

## 2019-01-21 DIAGNOSIS — Z6828 Body mass index (BMI) 28.0-28.9, adult: Secondary | ICD-10-CM | POA: Diagnosis not present

## 2019-03-28 DIAGNOSIS — Z23 Encounter for immunization: Secondary | ICD-10-CM | POA: Diagnosis not present

## 2019-04-25 DIAGNOSIS — Z23 Encounter for immunization: Secondary | ICD-10-CM | POA: Diagnosis not present

## 2019-05-22 DIAGNOSIS — Z7901 Long term (current) use of anticoagulants: Secondary | ICD-10-CM | POA: Diagnosis not present

## 2019-05-22 DIAGNOSIS — R0602 Shortness of breath: Secondary | ICD-10-CM | POA: Diagnosis not present

## 2019-05-22 DIAGNOSIS — R002 Palpitations: Secondary | ICD-10-CM | POA: Diagnosis not present

## 2019-05-22 DIAGNOSIS — Z79899 Other long term (current) drug therapy: Secondary | ICD-10-CM | POA: Diagnosis not present

## 2019-05-22 DIAGNOSIS — I480 Paroxysmal atrial fibrillation: Secondary | ICD-10-CM | POA: Diagnosis not present

## 2019-05-23 DIAGNOSIS — Z299 Encounter for prophylactic measures, unspecified: Secondary | ICD-10-CM | POA: Diagnosis not present

## 2019-05-23 DIAGNOSIS — D692 Other nonthrombocytopenic purpura: Secondary | ICD-10-CM | POA: Diagnosis not present

## 2019-05-23 DIAGNOSIS — I4891 Unspecified atrial fibrillation: Secondary | ICD-10-CM | POA: Diagnosis not present

## 2019-05-23 DIAGNOSIS — I10 Essential (primary) hypertension: Secondary | ICD-10-CM | POA: Diagnosis not present

## 2019-05-27 DIAGNOSIS — I480 Paroxysmal atrial fibrillation: Secondary | ICD-10-CM | POA: Diagnosis not present

## 2019-05-27 DIAGNOSIS — Z6830 Body mass index (BMI) 30.0-30.9, adult: Secondary | ICD-10-CM | POA: Diagnosis not present

## 2019-05-27 DIAGNOSIS — I10 Essential (primary) hypertension: Secondary | ICD-10-CM | POA: Diagnosis not present

## 2019-05-29 DIAGNOSIS — H401431 Capsular glaucoma with pseudoexfoliation of lens, bilateral, mild stage: Secondary | ICD-10-CM | POA: Diagnosis not present

## 2019-05-29 DIAGNOSIS — H35013 Changes in retinal vascular appearance, bilateral: Secondary | ICD-10-CM | POA: Diagnosis not present

## 2019-05-29 DIAGNOSIS — Z961 Presence of intraocular lens: Secondary | ICD-10-CM | POA: Diagnosis not present

## 2019-05-29 DIAGNOSIS — H35033 Hypertensive retinopathy, bilateral: Secondary | ICD-10-CM | POA: Diagnosis not present

## 2019-05-29 DIAGNOSIS — H35363 Drusen (degenerative) of macula, bilateral: Secondary | ICD-10-CM | POA: Diagnosis not present

## 2019-06-24 DIAGNOSIS — I4891 Unspecified atrial fibrillation: Secondary | ICD-10-CM | POA: Diagnosis not present

## 2019-06-24 DIAGNOSIS — Z6829 Body mass index (BMI) 29.0-29.9, adult: Secondary | ICD-10-CM | POA: Diagnosis not present

## 2019-06-24 DIAGNOSIS — Z79899 Other long term (current) drug therapy: Secondary | ICD-10-CM | POA: Diagnosis not present

## 2019-06-24 DIAGNOSIS — Z1331 Encounter for screening for depression: Secondary | ICD-10-CM | POA: Diagnosis not present

## 2019-06-24 DIAGNOSIS — Z0000 Encounter for general adult medical examination without abnormal findings: Secondary | ICD-10-CM | POA: Diagnosis not present

## 2019-06-24 DIAGNOSIS — Z1211 Encounter for screening for malignant neoplasm of colon: Secondary | ICD-10-CM | POA: Diagnosis not present

## 2019-06-24 DIAGNOSIS — R5383 Other fatigue: Secondary | ICD-10-CM | POA: Diagnosis not present

## 2019-06-24 DIAGNOSIS — Z1339 Encounter for screening examination for other mental health and behavioral disorders: Secondary | ICD-10-CM | POA: Diagnosis not present

## 2019-06-24 DIAGNOSIS — Z299 Encounter for prophylactic measures, unspecified: Secondary | ICD-10-CM | POA: Diagnosis not present

## 2019-06-24 DIAGNOSIS — Z7189 Other specified counseling: Secondary | ICD-10-CM | POA: Diagnosis not present

## 2019-06-24 DIAGNOSIS — E7800 Pure hypercholesterolemia, unspecified: Secondary | ICD-10-CM | POA: Diagnosis not present

## 2019-06-24 DIAGNOSIS — I10 Essential (primary) hypertension: Secondary | ICD-10-CM | POA: Diagnosis not present

## 2019-09-27 DIAGNOSIS — N390 Urinary tract infection, site not specified: Secondary | ICD-10-CM | POA: Diagnosis not present

## 2019-09-27 DIAGNOSIS — R109 Unspecified abdominal pain: Secondary | ICD-10-CM | POA: Diagnosis not present

## 2019-09-27 DIAGNOSIS — I10 Essential (primary) hypertension: Secondary | ICD-10-CM | POA: Diagnosis not present

## 2019-09-27 DIAGNOSIS — I4891 Unspecified atrial fibrillation: Secondary | ICD-10-CM | POA: Diagnosis not present

## 2019-09-27 DIAGNOSIS — Z299 Encounter for prophylactic measures, unspecified: Secondary | ICD-10-CM | POA: Diagnosis not present

## 2019-11-04 DIAGNOSIS — H401431 Capsular glaucoma with pseudoexfoliation of lens, bilateral, mild stage: Secondary | ICD-10-CM | POA: Diagnosis not present

## 2019-11-26 DIAGNOSIS — I480 Paroxysmal atrial fibrillation: Secondary | ICD-10-CM | POA: Diagnosis not present

## 2019-11-26 DIAGNOSIS — I10 Essential (primary) hypertension: Secondary | ICD-10-CM | POA: Diagnosis not present

## 2019-11-26 DIAGNOSIS — Z6829 Body mass index (BMI) 29.0-29.9, adult: Secondary | ICD-10-CM | POA: Diagnosis not present

## 2019-12-25 DIAGNOSIS — I10 Essential (primary) hypertension: Secondary | ICD-10-CM | POA: Diagnosis not present

## 2019-12-25 DIAGNOSIS — Z299 Encounter for prophylactic measures, unspecified: Secondary | ICD-10-CM | POA: Diagnosis not present

## 2019-12-25 DIAGNOSIS — I4891 Unspecified atrial fibrillation: Secondary | ICD-10-CM | POA: Diagnosis not present

## 2019-12-31 DIAGNOSIS — Z23 Encounter for immunization: Secondary | ICD-10-CM | POA: Diagnosis not present

## 2020-03-05 DIAGNOSIS — H401431 Capsular glaucoma with pseudoexfoliation of lens, bilateral, mild stage: Secondary | ICD-10-CM | POA: Diagnosis not present

## 2020-06-26 DIAGNOSIS — H35363 Drusen (degenerative) of macula, bilateral: Secondary | ICD-10-CM | POA: Diagnosis not present

## 2020-06-26 DIAGNOSIS — H35033 Hypertensive retinopathy, bilateral: Secondary | ICD-10-CM | POA: Diagnosis not present

## 2020-06-26 DIAGNOSIS — H47022 Hemorrhage in optic nerve sheath, left eye: Secondary | ICD-10-CM | POA: Diagnosis not present

## 2020-06-26 DIAGNOSIS — H401431 Capsular glaucoma with pseudoexfoliation of lens, bilateral, mild stage: Secondary | ICD-10-CM | POA: Diagnosis not present

## 2020-06-29 DIAGNOSIS — I10 Essential (primary) hypertension: Secondary | ICD-10-CM | POA: Diagnosis not present

## 2020-06-29 DIAGNOSIS — Z299 Encounter for prophylactic measures, unspecified: Secondary | ICD-10-CM | POA: Diagnosis not present

## 2020-06-29 DIAGNOSIS — Z6829 Body mass index (BMI) 29.0-29.9, adult: Secondary | ICD-10-CM | POA: Diagnosis not present

## 2020-06-29 DIAGNOSIS — Z0000 Encounter for general adult medical examination without abnormal findings: Secondary | ICD-10-CM | POA: Diagnosis not present

## 2020-06-29 DIAGNOSIS — E7800 Pure hypercholesterolemia, unspecified: Secondary | ICD-10-CM | POA: Diagnosis not present

## 2020-06-29 DIAGNOSIS — Z7189 Other specified counseling: Secondary | ICD-10-CM | POA: Diagnosis not present

## 2020-06-29 DIAGNOSIS — Z1339 Encounter for screening examination for other mental health and behavioral disorders: Secondary | ICD-10-CM | POA: Diagnosis not present

## 2020-06-29 DIAGNOSIS — Z1331 Encounter for screening for depression: Secondary | ICD-10-CM | POA: Diagnosis not present

## 2020-06-29 DIAGNOSIS — R5383 Other fatigue: Secondary | ICD-10-CM | POA: Diagnosis not present

## 2020-06-29 DIAGNOSIS — Z79899 Other long term (current) drug therapy: Secondary | ICD-10-CM | POA: Diagnosis not present

## 2020-07-27 DIAGNOSIS — I10 Essential (primary) hypertension: Secondary | ICD-10-CM | POA: Diagnosis not present

## 2020-07-27 DIAGNOSIS — Z299 Encounter for prophylactic measures, unspecified: Secondary | ICD-10-CM | POA: Diagnosis not present

## 2020-07-27 DIAGNOSIS — M545 Low back pain, unspecified: Secondary | ICD-10-CM | POA: Diagnosis not present

## 2020-07-27 DIAGNOSIS — R52 Pain, unspecified: Secondary | ICD-10-CM | POA: Diagnosis not present

## 2020-08-23 DIAGNOSIS — Z20822 Contact with and (suspected) exposure to covid-19: Secondary | ICD-10-CM | POA: Diagnosis not present

## 2020-09-20 DIAGNOSIS — N390 Urinary tract infection, site not specified: Secondary | ICD-10-CM | POA: Diagnosis not present

## 2020-10-12 DIAGNOSIS — Z1231 Encounter for screening mammogram for malignant neoplasm of breast: Secondary | ICD-10-CM | POA: Diagnosis not present

## 2020-10-27 DIAGNOSIS — M159 Polyosteoarthritis, unspecified: Secondary | ICD-10-CM | POA: Diagnosis not present

## 2020-10-27 DIAGNOSIS — I10 Essential (primary) hypertension: Secondary | ICD-10-CM | POA: Diagnosis not present

## 2020-10-27 DIAGNOSIS — M545 Low back pain, unspecified: Secondary | ICD-10-CM | POA: Diagnosis not present

## 2020-10-27 DIAGNOSIS — Z299 Encounter for prophylactic measures, unspecified: Secondary | ICD-10-CM | POA: Diagnosis not present

## 2020-10-27 DIAGNOSIS — D692 Other nonthrombocytopenic purpura: Secondary | ICD-10-CM | POA: Diagnosis not present

## 2020-10-27 DIAGNOSIS — I4891 Unspecified atrial fibrillation: Secondary | ICD-10-CM | POA: Diagnosis not present

## 2020-10-28 DIAGNOSIS — R922 Inconclusive mammogram: Secondary | ICD-10-CM | POA: Diagnosis not present

## 2020-10-28 DIAGNOSIS — R921 Mammographic calcification found on diagnostic imaging of breast: Secondary | ICD-10-CM | POA: Diagnosis not present

## 2020-11-20 DIAGNOSIS — Z299 Encounter for prophylactic measures, unspecified: Secondary | ICD-10-CM | POA: Diagnosis not present

## 2020-11-20 DIAGNOSIS — Z20828 Contact with and (suspected) exposure to other viral communicable diseases: Secondary | ICD-10-CM | POA: Diagnosis not present

## 2020-11-20 DIAGNOSIS — D6869 Other thrombophilia: Secondary | ICD-10-CM | POA: Diagnosis not present

## 2020-11-20 DIAGNOSIS — J069 Acute upper respiratory infection, unspecified: Secondary | ICD-10-CM | POA: Diagnosis not present

## 2020-11-20 DIAGNOSIS — I10 Essential (primary) hypertension: Secondary | ICD-10-CM | POA: Diagnosis not present

## 2020-11-20 DIAGNOSIS — I4891 Unspecified atrial fibrillation: Secondary | ICD-10-CM | POA: Diagnosis not present

## 2020-12-18 DIAGNOSIS — Z23 Encounter for immunization: Secondary | ICD-10-CM | POA: Diagnosis not present

## 2021-01-07 DIAGNOSIS — Z20828 Contact with and (suspected) exposure to other viral communicable diseases: Secondary | ICD-10-CM | POA: Diagnosis not present

## 2021-01-25 DIAGNOSIS — Z6831 Body mass index (BMI) 31.0-31.9, adult: Secondary | ICD-10-CM | POA: Diagnosis not present

## 2021-01-25 DIAGNOSIS — I10 Essential (primary) hypertension: Secondary | ICD-10-CM | POA: Diagnosis not present

## 2021-01-25 DIAGNOSIS — I480 Paroxysmal atrial fibrillation: Secondary | ICD-10-CM | POA: Diagnosis not present

## 2021-01-28 DIAGNOSIS — Z20828 Contact with and (suspected) exposure to other viral communicable diseases: Secondary | ICD-10-CM | POA: Diagnosis not present

## 2021-02-12 DIAGNOSIS — Z299 Encounter for prophylactic measures, unspecified: Secondary | ICD-10-CM | POA: Diagnosis not present

## 2021-02-12 DIAGNOSIS — M545 Low back pain, unspecified: Secondary | ICD-10-CM | POA: Diagnosis not present

## 2021-02-12 DIAGNOSIS — I10 Essential (primary) hypertension: Secondary | ICD-10-CM | POA: Diagnosis not present

## 2021-02-12 DIAGNOSIS — Z789 Other specified health status: Secondary | ICD-10-CM | POA: Diagnosis not present

## 2021-02-12 DIAGNOSIS — R350 Frequency of micturition: Secondary | ICD-10-CM | POA: Diagnosis not present

## 2021-02-12 DIAGNOSIS — N390 Urinary tract infection, site not specified: Secondary | ICD-10-CM | POA: Diagnosis not present

## 2021-02-26 DIAGNOSIS — Z789 Other specified health status: Secondary | ICD-10-CM | POA: Diagnosis not present

## 2021-02-26 DIAGNOSIS — I10 Essential (primary) hypertension: Secondary | ICD-10-CM | POA: Diagnosis not present

## 2021-02-26 DIAGNOSIS — I4891 Unspecified atrial fibrillation: Secondary | ICD-10-CM | POA: Diagnosis not present

## 2021-02-26 DIAGNOSIS — Z299 Encounter for prophylactic measures, unspecified: Secondary | ICD-10-CM | POA: Diagnosis not present

## 2021-02-26 DIAGNOSIS — Z6830 Body mass index (BMI) 30.0-30.9, adult: Secondary | ICD-10-CM | POA: Diagnosis not present

## 2021-02-26 DIAGNOSIS — N390 Urinary tract infection, site not specified: Secondary | ICD-10-CM | POA: Diagnosis not present

## 2021-03-02 DIAGNOSIS — R109 Unspecified abdominal pain: Secondary | ICD-10-CM | POA: Diagnosis not present

## 2021-03-02 DIAGNOSIS — Z299 Encounter for prophylactic measures, unspecified: Secondary | ICD-10-CM | POA: Diagnosis not present

## 2021-03-02 DIAGNOSIS — I4891 Unspecified atrial fibrillation: Secondary | ICD-10-CM | POA: Diagnosis not present

## 2021-03-02 DIAGNOSIS — I10 Essential (primary) hypertension: Secondary | ICD-10-CM | POA: Diagnosis not present

## 2021-03-02 DIAGNOSIS — Z6830 Body mass index (BMI) 30.0-30.9, adult: Secondary | ICD-10-CM | POA: Diagnosis not present

## 2021-03-04 DIAGNOSIS — R1030 Lower abdominal pain, unspecified: Secondary | ICD-10-CM | POA: Diagnosis not present

## 2021-03-04 DIAGNOSIS — R300 Dysuria: Secondary | ICD-10-CM | POA: Diagnosis not present

## 2021-03-04 DIAGNOSIS — R109 Unspecified abdominal pain: Secondary | ICD-10-CM | POA: Diagnosis not present

## 2021-03-26 DIAGNOSIS — Z20822 Contact with and (suspected) exposure to covid-19: Secondary | ICD-10-CM | POA: Diagnosis not present

## 2021-04-15 DIAGNOSIS — Z20828 Contact with and (suspected) exposure to other viral communicable diseases: Secondary | ICD-10-CM | POA: Diagnosis not present

## 2021-04-28 DIAGNOSIS — R921 Mammographic calcification found on diagnostic imaging of breast: Secondary | ICD-10-CM | POA: Diagnosis not present

## 2021-04-28 DIAGNOSIS — R922 Inconclusive mammogram: Secondary | ICD-10-CM | POA: Diagnosis not present

## 2021-05-17 DIAGNOSIS — M170 Bilateral primary osteoarthritis of knee: Secondary | ICD-10-CM | POA: Diagnosis not present

## 2021-05-31 DIAGNOSIS — Z20822 Contact with and (suspected) exposure to covid-19: Secondary | ICD-10-CM | POA: Diagnosis not present

## 2021-06-17 DIAGNOSIS — R051 Acute cough: Secondary | ICD-10-CM | POA: Diagnosis not present

## 2021-06-17 DIAGNOSIS — Z20822 Contact with and (suspected) exposure to covid-19: Secondary | ICD-10-CM | POA: Diagnosis not present

## 2021-06-17 DIAGNOSIS — R059 Cough, unspecified: Secondary | ICD-10-CM | POA: Diagnosis not present

## 2021-07-30 DIAGNOSIS — I4891 Unspecified atrial fibrillation: Secondary | ICD-10-CM | POA: Diagnosis not present

## 2021-07-30 DIAGNOSIS — N390 Urinary tract infection, site not specified: Secondary | ICD-10-CM | POA: Diagnosis not present

## 2021-07-30 DIAGNOSIS — Z299 Encounter for prophylactic measures, unspecified: Secondary | ICD-10-CM | POA: Diagnosis not present

## 2021-07-30 DIAGNOSIS — Z6830 Body mass index (BMI) 30.0-30.9, adult: Secondary | ICD-10-CM | POA: Diagnosis not present

## 2021-07-30 DIAGNOSIS — Z713 Dietary counseling and surveillance: Secondary | ICD-10-CM | POA: Diagnosis not present

## 2021-08-05 DIAGNOSIS — H35033 Hypertensive retinopathy, bilateral: Secondary | ICD-10-CM | POA: Diagnosis not present

## 2021-08-05 DIAGNOSIS — N390 Urinary tract infection, site not specified: Secondary | ICD-10-CM | POA: Diagnosis not present

## 2021-08-05 DIAGNOSIS — H401431 Capsular glaucoma with pseudoexfoliation of lens, bilateral, mild stage: Secondary | ICD-10-CM | POA: Diagnosis not present

## 2021-08-05 DIAGNOSIS — Z299 Encounter for prophylactic measures, unspecified: Secondary | ICD-10-CM | POA: Diagnosis not present

## 2021-08-05 DIAGNOSIS — Z6829 Body mass index (BMI) 29.0-29.9, adult: Secondary | ICD-10-CM | POA: Diagnosis not present

## 2021-08-05 DIAGNOSIS — H35013 Changes in retinal vascular appearance, bilateral: Secondary | ICD-10-CM | POA: Diagnosis not present

## 2021-08-05 DIAGNOSIS — H35363 Drusen (degenerative) of macula, bilateral: Secondary | ICD-10-CM | POA: Diagnosis not present

## 2021-08-16 DIAGNOSIS — Z1339 Encounter for screening examination for other mental health and behavioral disorders: Secondary | ICD-10-CM | POA: Diagnosis not present

## 2021-08-16 DIAGNOSIS — Z299 Encounter for prophylactic measures, unspecified: Secondary | ICD-10-CM | POA: Diagnosis not present

## 2021-08-16 DIAGNOSIS — Z6830 Body mass index (BMI) 30.0-30.9, adult: Secondary | ICD-10-CM | POA: Diagnosis not present

## 2021-08-16 DIAGNOSIS — R5383 Other fatigue: Secondary | ICD-10-CM | POA: Diagnosis not present

## 2021-08-16 DIAGNOSIS — N390 Urinary tract infection, site not specified: Secondary | ICD-10-CM | POA: Diagnosis not present

## 2021-08-16 DIAGNOSIS — Z7189 Other specified counseling: Secondary | ICD-10-CM | POA: Diagnosis not present

## 2021-08-16 DIAGNOSIS — E7800 Pure hypercholesterolemia, unspecified: Secondary | ICD-10-CM | POA: Diagnosis not present

## 2021-08-16 DIAGNOSIS — Z1331 Encounter for screening for depression: Secondary | ICD-10-CM | POA: Diagnosis not present

## 2021-08-16 DIAGNOSIS — Z79899 Other long term (current) drug therapy: Secondary | ICD-10-CM | POA: Diagnosis not present

## 2021-08-16 DIAGNOSIS — Z0000 Encounter for general adult medical examination without abnormal findings: Secondary | ICD-10-CM | POA: Diagnosis not present

## 2021-09-09 ENCOUNTER — Ambulatory Visit (INDEPENDENT_AMBULATORY_CARE_PROVIDER_SITE_OTHER): Payer: Medicare Other | Admitting: Physician Assistant

## 2021-09-09 VITALS — BP 128/84 | HR 69

## 2021-09-09 DIAGNOSIS — N3001 Acute cystitis with hematuria: Secondary | ICD-10-CM | POA: Diagnosis not present

## 2021-09-09 DIAGNOSIS — N3289 Other specified disorders of bladder: Secondary | ICD-10-CM | POA: Diagnosis not present

## 2021-09-09 DIAGNOSIS — R300 Dysuria: Secondary | ICD-10-CM | POA: Diagnosis not present

## 2021-09-09 DIAGNOSIS — R339 Retention of urine, unspecified: Secondary | ICD-10-CM

## 2021-09-09 DIAGNOSIS — R1030 Lower abdominal pain, unspecified: Secondary | ICD-10-CM | POA: Diagnosis not present

## 2021-09-09 DIAGNOSIS — N390 Urinary tract infection, site not specified: Secondary | ICD-10-CM | POA: Diagnosis not present

## 2021-09-09 LAB — BLADDER SCAN AMB NON-IMAGING: Scan Result: 210

## 2021-09-09 MED ORDER — AMOXICILLIN-POT CLAVULANATE 875-125 MG PO TABS: 1.0000 | ORAL_TABLET | Freq: Two times a day (BID) | ORAL | 0 refills | Status: DC

## 2021-09-09 NOTE — Progress Notes (Signed)
09/09/2021 1:54 PM   Sherry Smith 1940/03/14 [id number]   Assessment:  1. Recurrent UTI - BLADDER SCAN AMB NON-IMAGING - Urinalysis, Routine w reflex microscopic - Urine Culture  2. Acute cystitis with hematuria - CT RENAL STONE STUDY - Urine Culture  3. Bladder spasm  4. Incomplete bladder emptying  5. Dysuria  6. Lower abdominal pain - CT RENAL STONE STUDY    Plan: Urine culture ordered and based on the only culture available, Augmentin prescribed for 7 days.  We will adjust therapy if indicated by culture results.  Discussed findings and plan of care at length with the patient.  Because of her significant discomfort, lower abdominal pain, incomplete emptying, CT stone ordered to rule out urinary calculus as the source of recurrent infections and her discomfort.  She will follow-up after the CT to consider cystoscopic evaluation  Chief Complaint: No chief complaint on file.   Referring provider: Glenda Chroman, MD [place],  [place] [address]   History of Present Illness:  Sherry Smith is a 81 y.o. year old female with a history of hypertension, atrial fibrillation on Eliquis, esophageal reflux disease who is seen in consultation from Glenda Chroman, MD for evaluation of recurrent UTIs.  Patient reports she has had ongoing and recurrent urinary tract infections for several years and current increase in symptoms began approximately January of this year.  She reports several infections treated with various antibiotics including Cipro and amoxicillin without cultures.  She states she has never had relief of symptoms of urgency, frequency, lower abdominal pain although symptoms do improve somewhat but returned after antibiotics.  Currently she complains of significant cramp-like pain in her lower abdomen during and especially after voiding.  She currently has no burning and denies gross hematuria.  She denies history of urinary stone disease and denies significant  incontinence.  She does wear 1 pad per day, but states it is always dry.  Additionally she reports significant night sweats ongoing for approximately 1 year.  Her PCP is aware.  TSH noted in 2021 to be normal.  Her last gynecology evaluation was 2 years ago and she has no complaints of vaginal dryness or itching.  She has had singular oophorectomy in the past. Medical records indicate UA abnormal on 08/05/2021.  Culture positive for multidrug-resistant E. coli.  Greater than 100,000 colonies.  Resistance noted to ampicillin, cefazolin and all cephalosporins except cefepime.  Quinolones resistant, tetracycline and trimethoprim resistant.  Patient was treated with Ceftin followed by at bedtime Macrodantin.  The patient developed a severe rash after initiating the Macrodantin.  This was added to her allergy list.  KUB from March 03, 2021 indicated some constipation but no stones were identified.  Renal function normal.  Urinalysis = greater than 30 WBCs, 3-10 RBCs, greater than 10 epi's, many mucus threads, no bacteria, nitrite positive PVR = 210 mL  Recent medical records including lab studies reviewed from Dr. Woody Seller.  Past Medical History:  Past Medical History:  Diagnosis Date   Chronic headaches 11/29/2016    Past Surgical History:  Past Surgical History:  Procedure Laterality Date   Ruptured ovary     left ovary in the 60s    Allergies:  Allergies  Allergen Reactions   Sulfa Antibiotics     Headaches.   Tetracyclines & Related     Yeast infection    Family History:  No family history on file.  Social History:  Social History   Tobacco  Use   Smoking status: Never   Smokeless tobacco: Never  Substance Use Topics   Alcohol use: No   Drug use: No    Review of symptoms:  Constitutional:  Negative for unexplained weight loss, fever, chills ENT:  Negative for nose bleeds, sinus pain, painful swallowing CV:  Negative for chest pain, shortness of breath Resp:  Negative for  cough, wheezing, shortness of breath GI:  Negative for nausea, vomiting, diarrhea, bloody stools GU:  Positives noted in HPI; otherwise negative for gross hematuria Neuro:  Negative for seizures, poor balance, limb weakness, slurred speech Psych:  Negative for lack of energy, depression, anxiety Endocrine:  Negative for polydipsia, polyuria, symptoms of hypoglycemia (dizziness, hunger, sweating) Hematologic:  Negative for anemia, purpura, petechia, prolonged or excessive bleeding  Physical Exam: BP 128/84   Pulse 69   Constitutional:  Alert and oriented, No acute distress. HEENT: NCAT, moist mucus membranes.  Trachea midline, no masses. Cardiovascular: Regular rate and rhythm without murmur, rub, or gallops No clubbing, cyanosis.  Moderate bilateral lower extremity edema noted with trace pitting. Respiratory: Normal respiratory effort, clear to auscultation bilaterally GI: Abdomen is soft, mild left lower quadrant tenderness without guarding, nondistended, no abdominal masses BACK:  Non-tender to palpation.  No CVAT Skin: No obvious rashes, warm, dry, intact Neurologic: Alert and oriented, Cranial nerves grossly intact, no focal deficits, moving all 4 extremities. Psychiatric: Appropriate. Normal mood and affect.  Laboratory Data: No results found for this or any previous visit (from the past 24 hour(s)).  No results found for: "WBC", "HGB", "HCT", "MCV", "PLT"   Urinalysis No results found for: "COLORURINE", "APPEARANCEUR", "LABSPEC", "PHURINE", "GLUCOSEU", "HGBUR", "BILIRUBINUR", "KETONESUR", "PROTEINUR", "UROBILINOGEN", "NITRITE", "LEUKOCYTESUR"  No results found for: "LABMICR", "WBCUA", "RBCUA", "LABEPIT", "MUCUS", "BACTERIA"  Pertinent Imaging: No results found for this or any previous visit.  No results found for this or any previous visit.     [place], Berneice Heinrich, PA-C [facility] [place]

## 2021-09-10 LAB — URINALYSIS, ROUTINE W REFLEX MICROSCOPIC
Bilirubin, UA: NEGATIVE
Glucose, UA: NEGATIVE
Ketones, UA: NEGATIVE
Nitrite, UA: POSITIVE — AB
Specific Gravity, UA: 1.01 (ref 1.005–1.030)
Urobilinogen, Ur: 0.2 mg/dL (ref 0.2–1.0)
pH, UA: 5.5 (ref 5.0–7.5)

## 2021-09-10 LAB — MICROSCOPIC EXAMINATION
Epithelial Cells (non renal): >10 /hpf — AB (ref 0–10)
Renal Epithel, UA: NONE SEEN /hpf
WBC, UA: >30 /hpf — AB (ref 0–5)

## 2021-09-13 LAB — URINE CULTURE

## 2021-09-15 ENCOUNTER — Ambulatory Visit (HOSPITAL_COMMUNITY)
Admission: RE | Admit: 2021-09-15 | Discharge: 2021-09-15 | Disposition: A | Discharge status: Home / Self Care | Payer: Medicare Other | Source: Ambulatory Visit | Attending: Physician Assistant | Admitting: Physician Assistant

## 2021-09-15 DIAGNOSIS — R1030 Lower abdominal pain, unspecified: Secondary | ICD-10-CM | POA: Diagnosis not present

## 2021-09-15 DIAGNOSIS — K4090 Unilateral inguinal hernia, without obstruction or gangrene, not specified as recurrent: Secondary | ICD-10-CM | POA: Diagnosis not present

## 2021-09-15 DIAGNOSIS — N281 Cyst of kidney, acquired: Secondary | ICD-10-CM | POA: Diagnosis not present

## 2021-09-15 DIAGNOSIS — K429 Umbilical hernia without obstruction or gangrene: Secondary | ICD-10-CM | POA: Diagnosis not present

## 2021-09-15 DIAGNOSIS — N3001 Acute cystitis with hematuria: Secondary | ICD-10-CM | POA: Diagnosis not present

## 2021-09-15 DIAGNOSIS — K8020 Calculus of gallbladder without cholecystitis without obstruction: Secondary | ICD-10-CM | POA: Diagnosis not present

## 2021-09-16 ENCOUNTER — Telehealth: Payer: Self-pay

## 2021-09-16 NOTE — Telephone Encounter (Addendum)
Patient informed of results and will finish antibiotic today.  She states she is doing better but still has some slight discomfort in her bottom.  Informed her to give [place] a call if her symptoms do not improve and we will recheck her urine.

## 2021-09-16 NOTE — Telephone Encounter (Signed)
-----   Message from Cleon Gustin, MD sent at 09/15/2021  5:11 PM EDT ----- ok ----- Message ----- From: Audie Box, CMA Sent: 09/15/2021   4:33 PM EDT To: Cleon Gustin, MD  Please review, pt taking Augmentin

## 2021-09-22 ENCOUNTER — Ambulatory Visit (HOSPITAL_COMMUNITY): Payer: Medicare Other

## 2021-09-24 ENCOUNTER — Other Ambulatory Visit: Payer: Medicare Other

## 2021-09-24 DIAGNOSIS — N390 Urinary tract infection, site not specified: Secondary | ICD-10-CM | POA: Diagnosis not present

## 2021-09-24 LAB — URINALYSIS, ROUTINE W REFLEX MICROSCOPIC
Bilirubin, UA: NEGATIVE
Glucose, UA: NEGATIVE
Ketones, UA: NEGATIVE
Nitrite, UA: NEGATIVE
Protein,UA: NEGATIVE
Specific Gravity, UA: 1.01 (ref 1.005–1.030)
Urobilinogen, Ur: 0.2 mg/dL (ref 0.2–1.0)
pH, UA: 6 (ref 5.0–7.5)

## 2021-09-24 LAB — MICROSCOPIC EXAMINATION
Renal Epithel, UA: NONE SEEN /hpf
WBC, UA: >30 /hpf — AB (ref 0–5)

## 2021-09-24 NOTE — Telephone Encounter (Signed)
Patient left urine sample today due to UTI symptoms

## 2021-09-27 ENCOUNTER — Telehealth: Payer: Self-pay

## 2021-09-27 ENCOUNTER — Other Ambulatory Visit: Payer: Self-pay | Admitting: Physician Assistant

## 2021-09-27 LAB — URINE CULTURE

## 2021-09-27 MED ORDER — AMOXICILLIN-POT CLAVULANATE 875-125 MG PO TABS: 1.0000 | ORAL_TABLET | Freq: Two times a day (BID) | ORAL | 0 refills | Status: DC

## 2021-09-27 NOTE — Telephone Encounter (Signed)
Patient called and notified to start medication today and take daily. Patient voiced understanding and will keep scheduled appointment this Wednesday.

## 2021-09-27 NOTE — Telephone Encounter (Signed)
-----   Message from [facility], [place] sent at 09/27/2021  4:11 PM EDT ----- Please let the pt know her drop off urine culture is positive and she needs new antibx. I will send in Rx for Macrobid which she needs to start asap. Keep appt with Dr. Alyson Ingles as scheduled.a  ----- Message ----- From: Interface, [facility] Lab Results In Sent: 09/24/2021   1:25 PM EDT To: Reynaldo Minium, PA-C

## 2021-09-29 ENCOUNTER — Ambulatory Visit (INDEPENDENT_AMBULATORY_CARE_PROVIDER_SITE_OTHER): Payer: Medicare Other | Admitting: Urology

## 2021-09-29 VITALS — BP 158/72 | HR 62

## 2021-09-29 DIAGNOSIS — N390 Urinary tract infection, site not specified: Secondary | ICD-10-CM | POA: Diagnosis not present

## 2021-09-29 DIAGNOSIS — R339 Retention of urine, unspecified: Secondary | ICD-10-CM

## 2021-09-29 LAB — BLADDER SCAN AMB NON-IMAGING: Scan Result: 194

## 2021-09-29 MED ORDER — CIPROFLOXACIN HCL 500 MG PO TABS: 500.0000 mg | ORAL_TABLET | Freq: Once | ORAL | Status: DC

## 2021-09-29 NOTE — Progress Notes (Signed)
post void residual=194

## 2021-10-06 ENCOUNTER — Ambulatory Visit (INDEPENDENT_AMBULATORY_CARE_PROVIDER_SITE_OTHER): Payer: Medicare Other | Admitting: Urology

## 2021-10-06 VITALS — BP 148/83 | HR 64

## 2021-10-06 DIAGNOSIS — Z87440 Personal history of urinary (tract) infections: Secondary | ICD-10-CM

## 2021-10-06 DIAGNOSIS — R339 Retention of urine, unspecified: Secondary | ICD-10-CM

## 2021-10-06 DIAGNOSIS — N390 Urinary tract infection, site not specified: Secondary | ICD-10-CM

## 2021-10-06 MED ORDER — CIPROFLOXACIN HCL 500 MG PO TABS
500.0000 mg | ORAL_TABLET | Freq: Once | ORAL | Status: AC
  Administered 2021-10-06: 500 mg via ORAL

## 2021-10-06 MED ORDER — METHENAMINE HIPPURATE 1 G PO TABS: 1.0000 g | ORAL_TABLET | Freq: Two times a day (BID) | ORAL | 5 refills | Status: DC

## 2021-10-06 NOTE — Progress Notes (Signed)
   10/06/21  CC: recurrent UTI   HPI: Ms Sunde is a 81yo here for cystoscopy for recurrent UTI Blood pressure (!) 148/83, pulse 64. NED. A&Ox3.   No respiratory distress   Abd soft, NT, ND Normal external genitalia with patent urethral meatus  Cystoscopy Procedure Note  Patient identification was confirmed, informed consent was obtained, and patient was prepped using Betadine solution.  Lidocaine jelly was administered per urethral meatus.    Procedure: - Flexible cystoscope introduced, without any difficulty.   - Thorough search of the bladder revealed:    normal urethral meatus    normal urothelium    no stones    no ulcers     no tumors    no urethral polyps    no trabeculation  - Ureteral orifices were normal in position and appearance.  Post-Procedure: - Patient tolerated the procedure well  Assessment/ Plan: -We discussed the natural hx of recurrent UTIs and the various causes. We discussed the treatment options including post coital prophylaxis, daily prophylaxis, topical estrogen therapy. We will proceed with methenamine therapy   No follow-ups on file.  Nicolette Bang, MD

## 2021-10-14 ENCOUNTER — Encounter: Payer: Self-pay | Admitting: Urology

## 2021-10-14 NOTE — Patient Instructions (Signed)
Urinary Tract Infection, Adult A urinary tract infection (UTI) is an infection of any part of the urinary tract. The urinary tract includes: The kidneys. The ureters. The bladder. The urethra. These organs make, store, and get rid of pee (urine) in the body. What are the causes? This infection is caused by germs (bacteria) in your genital area. These germs grow and cause swelling (inflammation) of your urinary tract. What increases the risk? The following factors may make you more likely to develop this condition: Using a small, thin tube (catheter) to drain pee. Not being able to control when you pee or poop (incontinence). Being female. If you are female, these things can increase the risk: Using these methods to prevent pregnancy: A medicine that kills sperm (spermicide). A device that blocks sperm (diaphragm). Having low levels of a female hormone (estrogen). Being pregnant. You are more likely to develop this condition if: You have genes that add to your risk. You are sexually active. You take antibiotic medicines. You have trouble peeing because of: A prostate that is bigger than normal, if you are female. A blockage in the part of your body that drains pee from the bladder. A kidney stone. A nerve condition that affects your bladder. Not getting enough to drink. Not peeing often enough. You have other conditions, such as: Diabetes. A weak disease-fighting system (immune system). Sickle cell disease. Gout. Injury of the spine. What are the signs or symptoms? Symptoms of this condition include: Needing to pee right away. Peeing small amounts often. Pain or burning when peeing. Blood in the pee. Pee that smells bad or not like normal. Trouble peeing. Pee that is cloudy. Fluid coming from the vagina, if you are female. Pain in the belly or lower back. Other symptoms include: Vomiting. Not feeling hungry. Feeling mixed up (confused). This may be the first symptom in  older adults. Being tired and grouchy (irritable). A fever. Watery poop (diarrhea). How is this treated? Taking antibiotic medicine. Taking other medicines. Drinking enough water. In some cases, you may need to see a specialist. Follow these instructions at home:  Medicines Take over-the-counter and prescription medicines only as told by your doctor. If you were prescribed an antibiotic medicine, take it as told by your doctor. Do not stop taking it even if you start to feel better. General instructions Make sure you: Pee until your bladder is empty. Do not hold pee for a long time. Empty your bladder after sex. Wipe from front to back after peeing or pooping if you are a female. Use each tissue one time when you wipe. Drink enough fluid to keep your pee pale yellow. Keep all follow-up visits. Contact a doctor if: You do not get better after 1-2 days. Your symptoms go away and then come back. Get help right away if: You have very bad back pain. You have very bad pain in your lower belly. You have a fever. You have chills. You feeling like you will vomit or you vomit. Summary A urinary tract infection (UTI) is an infection of any part of the urinary tract. This condition is caused by germs in your genital area. There are many risk factors for a UTI. Treatment includes antibiotic medicines. Drink enough fluid to keep your pee pale yellow. This information is not intended to replace advice given to you by your health care provider. Make sure you discuss any questions you have with your health care provider. Document Revised: 09/13/2019 Document Reviewed: 09/13/2019 Elsevier Patient Education    2023 [employer]  

## 2021-10-27 ENCOUNTER — Other Ambulatory Visit: Payer: Self-pay | Admitting: Internal Medicine

## 2021-10-27 DIAGNOSIS — R922 Inconclusive mammogram: Secondary | ICD-10-CM | POA: Diagnosis not present

## 2021-10-27 DIAGNOSIS — R928 Other abnormal and inconclusive findings on diagnostic imaging of breast: Secondary | ICD-10-CM | POA: Diagnosis not present

## 2021-10-27 DIAGNOSIS — R921 Mammographic calcification found on diagnostic imaging of breast: Secondary | ICD-10-CM | POA: Diagnosis not present

## 2021-11-10 ENCOUNTER — Ambulatory Visit
Admission: RE | Admit: 2021-11-10 | Discharge: 2021-11-10 | Disposition: A | Discharge status: Home / Self Care | Payer: Medicare Other | Source: Ambulatory Visit | Attending: Internal Medicine | Admitting: Internal Medicine

## 2021-11-10 ENCOUNTER — Other Ambulatory Visit: Payer: Self-pay | Admitting: Internal Medicine

## 2021-11-10 DIAGNOSIS — R921 Mammographic calcification found on diagnostic imaging of breast: Secondary | ICD-10-CM | POA: Diagnosis not present

## 2021-11-10 DIAGNOSIS — D0512 Intraductal carcinoma in situ of left breast: Secondary | ICD-10-CM | POA: Diagnosis not present

## 2021-11-10 HISTORY — PX: BREAST BIOPSY: SHX20

## 2021-11-24 ENCOUNTER — Encounter: Payer: Self-pay | Admitting: *Deleted

## 2021-11-24 DIAGNOSIS — D0510 Intraductal carcinoma in situ of unspecified breast: Secondary | ICD-10-CM | POA: Insufficient documentation

## 2021-11-24 DIAGNOSIS — D0512 Intraductal carcinoma in situ of left breast: Secondary | ICD-10-CM | POA: Insufficient documentation

## 2021-11-25 ENCOUNTER — Encounter: Payer: Self-pay | Admitting: General Surgery

## 2021-11-25 ENCOUNTER — Ambulatory Visit (INDEPENDENT_AMBULATORY_CARE_PROVIDER_SITE_OTHER): Payer: Medicare Other | Admitting: General Surgery

## 2021-11-25 VITALS — BP 162/84 | HR 52 | Temp 98.0°F | Resp 12 | Ht 63.0 in | Wt 168.0 lb

## 2021-11-25 DIAGNOSIS — D0512 Intraductal carcinoma in situ of left breast: Secondary | ICD-10-CM

## 2021-11-25 NOTE — Patient Instructions (Addendum)
Plan for radiofrequency tag placement by radiology and then surgery / removing part of breast with Dr. Constance Haw. Someone will call you with the dates on 11/30/2021.   Lumpectomy  A lumpectomy, sometimes called a partial mastectomy, is surgery to remove a cancerous tumor or mass (the lump) from a breast. It is a form of breast-conserving or breast-preservation surgery. This means that the cancerous tissue is removed but the breast remains intact. During a lumpectomy, the portion of the breast that contains the tumor is removed. Lymph nodes under your arm may also be removed. Lymph nodes are part of the body's disease-fighting system (immune system) and are usually the first place where breast cancer spreads. Tell a health care provider about: Any allergies you have. All medicines you are taking, including vitamins, herbs, eye drops, creams, and over-the-counter medicines. Any problems you or family members have had with anesthetic medicines. Any bleeding problems you have. Any surgeries you have had. Any medical conditions you have. Whether you are pregnant or may be pregnant. What are the risks? Generally, this is a safe procedure. However, problems may occur, including: Bleeding. Infection. Allergic reaction to medicines. Pain, swelling, weakness, or numbness in the arm on the side of your surgery. Temporary swelling. Change in the shape of the breast, especially if a large portion is removed. Scar tissue that forms at the surgical site and feels hard to the touch. Blood clots. What happens before the procedure? Medicines Ask your health care provider about: Changing or stopping your regular medicines. These include any diabetes medicines or blood thinners you take. Taking medicines such as aspirin and ibuprofen. These medicines can thin your blood. Do not take them unless your health care provider tells you to. Taking over-the-counter medicines, vitamins, herbs, and  supplements. Surgery safety Ask your health care provider how your surgery site will be marked. A procedure may be done to locate and mark the tumor area in the breast (localization). This will guide the surgeon to where the incision will be made. This may be done with: Imaging, such as a mammogram, ultrasound, or MRI. Insertion of a small wire, clip, or seed, or an implant that will reflect a radar signal. Also, ask what steps will be taken to help prevent infection. These may include: Washing skin with a germ-killing soap. Taking antibiotic medicine. General instructions You may have screening tests or exams to get normal measurements of your arm, also called baseline measurements. These can be compared to measurements done after surgery to monitor for swelling (lymphedema) that can develop after having lymph nodes removed. If you will be going home right after the procedure, plan to have a responsible adult: Take you home from the hospital or clinic. You will not be allowed to drive. Care for you for the time you are told. What happens during the procedure?  An IV will be inserted into one of your veins. You may be given: A sedative. This helps you relax. Anesthesia. This will: Numb certain areas of your body. Make you fall asleep for surgery. An electric scalpel will be used to reduce bleeding (electrocautery knife). A curved incision that follows the natural curve of your breast will be made. The tumor will be removed along with some of the tissue around it. This will be sent to the lab for testing. Your health care provider may also remove lymph nodes at this time if needed. If the tumor is close to the muscles over your chest, some muscle tissue may also be  removed. A small drain tube may be inserted into your breast area or armpit to collect fluid that may build up after surgery. This tube will be connected to a suction bulb on the outside of your body to remove the fluid. The  incision will be closed with stitches (sutures). A bandage (dressing) may be placed over the incision. The procedure may vary among health care providers and hospitals. What happens after the procedure? Your blood pressure, heart rate, breathing rate, and blood oxygen level will be monitored until you leave the hospital or clinic. You will be given medicine for pain as needed. You will be encouraged to get up and walk as soon as you can. This will improve blood flow and breathing. Ask for help if you feel weak or unsteady. You may have a drain tube in place for 2-3 days to prevent a collection of blood (hematoma) from developing in the breast. You may have a pressure bandage applied for 1-2 days to prevent bleeding or swelling. Ask your health care provider how to care for your bandage at home. You may be given a tight sleeve to wear over your arm on the side of your surgery. Wear the sleeve as told by your health care provider. Do not drive or operate machinery until your health care provider says that it is safe. Where to find more information American Cancer Society: cancer.[place]: cancer.gov Summary A lumpectomy, sometimes called a partial mastectomy, is surgery to remove a cancerous tumor or mass (the lump) from a breast. During a lumpectomy, the portion of the breast that contains the tumor is removed. Plan to have someone take you home from the hospital or clinic. You may have a drain tube in place for 2-3 days to prevent a collection of blood (hematoma) from developing in the breast. This information is not intended to replace advice given to you by your health care provider. Make sure you discuss any questions you have with your health care provider. Document Revised: 04/26/2021 Document Reviewed: 04/11/2021 Elsevier Patient Education  [place]. Ductal Carcinoma In Situ  Ductal carcinoma in situ is the presence of abnormal cells in the breast. It is  the earliest form of breast cancer. The abnormal cells are located only in the tubes that carry milk to the nipple (milk ducts) and have not spread to other areas. What are the causes? The exact cause of ductal carcinoma in situ is unknown. What increases the risk? The following factors increase the risk of developing ductal carcinoma in situ: Being older than 82 years of age. Being female. Having a family history of breast cancer. Starting menopause after age 56. Starting your menstrual periods before age 68. Having never been pregnant or having your first child after age 25. Having never breastfed. A personal history of: Breast cancer. Dense breast tissue. Radiation treatments to the breasts or chest area. Having the BRCA1 and BRCA2 genes. Having certain types of noncancerous (benign) breast conditions, such as non-proliferative lesions, proliferative lesions with or without atypia (cell abnormalities), or lobular carcinoma in situ. Exposure to the drug DES, which was given to pregnant women from the 1940s to the 1970s. Other risks include: Current or past hormone use, such as: Using birth control. Taking hormone therapy after menopause. Being overweight or obese after menopause. Having an inactive (sedentary) lifestyle. Drinking more than one alcoholic beverage a day. What are the signs or symptoms? Ductal carcinoma in situ does not cause any symptoms. How is this  diagnosed? Ductal carcinoma in situ is usually discovered during a routine X-ray of the breasts to check for abnormal changes (mammogram). To diagnose the condition, your health care provider may do an ultrasound and remove a tissue sample from your breast so it can be examined under a microscope (breast biopsy). Your health care provider may also remove one or more lymph nodes from under your arm to check if the abnormal cells have spread to your lymph nodes (sentinel lymph node biopsy). Lymph nodes are part of the body's  disease-fighting system (immune system). They are located throughout the body. The lymph nodes under the arms are usually the first place where abnormal cells spread. How is this treated? Treatment for this condition may include: A lumpectomy. This is surgery to remove the area of abnormal cells, along with a ring of normal tissue. This may also be called breast-conserving surgery. Simple mastectomy. This is surgery to remove breast tissue, the nipple, and the circle of colored tissue around the nipple (areola). Sometimes, one or more lymph nodes from under the arm are also removed and tested for cancer cells. Preventive mastectomy. This is the removal of both breasts. This is usually done only if you have a very high risk of developing breast cancer. Radiation. This is the use of high-energy rays to kill cancer cells. Hormone therapy, which are medicines to keep the abnormal cells from spreading. Follow these instructions at home: Lifestyle Eat a healthy diet. A healthy diet includes lots of fruits and vegetables, low-fat dairy products, lean meats, and fiber. Make sure half your plate is filled with fruits or vegetables. Choose high-fiber foods such as whole-grain breads and cereals. Do not use any products that contain nicotine or tobacco. These products include cigarettes, chewing tobacco, and vaping devices, such as e-cigarettes. If you need help quitting, ask your health care provider. Alcohol use Do not drink alcohol if: Your health care provider tells you not to drink. You are pregnant, may be pregnant, or are planning to become pregnant. If you drink alcohol: Limit how much you have to 0-1 drink a day. Know how much alcohol is in your drink. In the [place] one drink equals one 12 oz bottle of beer (355 mL), one 5 oz glass of wine (148 mL), or one 1 oz glass of hard liquor (44 mL). General instructions Take over-the-counter and prescription medicines only as told by your health care  provider. Keep all follow-up visits. This is important. Where to find more information American Cancer Society: www.cancer.[place]: www.cancer.gov Contact a health care provider if: You have a fever. You notice a new lump in either breast or under your arm. You have any symptoms or changes that concern you. You notice new fatigue or weakness. Get help right away if: You have chest pain or trouble breathing. These symptoms may be an emergency. Get help right away. Call 911. Do not wait to see if the symptoms will go away. Do not drive yourself to the hospital. Summary Ductal carcinoma in situ is the presence of abnormal cells in the breast. It is the earliest form of breast cancer. The exact cause of ductal carcinoma in situ is unknown. Among other factors, the risk increases with age and with current or past hormone use. To diagnose the condition, your health care provider will remove a tissue sample from your breast so it can be examined under a microscope (breast biopsy). This information is not intended to replace advice given to you by  your health care provider. Make sure you discuss any questions you have with your health care provider. Document Revised: 11/05/2020 Document Reviewed: 11/05/2020 Elsevier Patient Education  [place].

## 2021-11-25 NOTE — Progress Notes (Signed)
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 female.  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    The patient has no history of any masses, lumps, bumps, nipple changes or discharge. She had menarche at age 11, and her first regnancy at age 19. She has a history of any family breast cancer in her sister in her 50s and a paternal aunt.  She underwent menopause at 34.  She had a previous biopsies in her 30s that came back benign. She has not had any chest radiation.       Past Medical History:  Diagnosis Date   Atrial fibrillation (HCC)     Chronic headaches 11/29/2016           Past Surgical History:  Procedure Laterality Date   CATARACT EXTRACTION, BILATERAL       Ruptured ovary        left ovary in the 60s           Family History  Problem Relation Age of Onset   Breast cancer Sister     Breast cancer Paternal Aunt        Social History        Tobacco Use   Smoking status: Never   Smokeless tobacco: Never  Substance Use Topics   Alcohol use: No   Drug use: No      Medications: I have reviewed the patient's current medications. Allergies as of 11/25/2021         Reactions    Macrodantin [nitrofurantoin] Rash    Sulfa Antibiotics      Headaches.    Tetracyclines & Related      Yeast infection            Medication List           Accurate as of November 25, 2021 11:59 PM. If you have any questions, ask your nurse or doctor.              STOP taking these medications     amoxicillin-clavulanate 875-125 MG tablet Commonly known as: AUGMENTIN Stopped by: Sherry Smith    butalbital-acetaminophen-caffeine 50-325-40 MG tablet Commonly known as: FIORICET Stopped by: Sherry Smith Smith Sherry Renfrew, Smith    celecoxib 100 MG  capsule Commonly known as: CELEBREX Stopped by: Sherry Smith Smith Sherry Kann, Smith    metoprolol tartrate 25 MG tablet Commonly known as: LOPRESSOR Stopped by: Sherry Smith Smith Sherry Dimauro, Smith           TAKE these medications     Eliquis 5 MG Tabs tablet Generic drug: apixaban Take 5 mg by mouth 2 (two) times daily.    latanoprost 0.005 % ophthalmic solution Commonly known as: XALATAN SMARTSIG:1 Drop(s) In Eye(s) Every Evening    methenamine 1 g tablet Commonly known as: Hiprex Take 1 tablet (1 g total) by mouth 2 (two) times daily with a meal.    metoprolol succinate 25 MG 24 hr tablet Commonly known as: TOPROL-XL Take 12.5 mg by mouth 2 (two) times daily.    pantoprazole 40 MG tablet Commonly known as: PROTONIX Take 40 mg by mouth daily.               ROS:    A comprehensive review of systems was negative except for: Genitourinary: positive for frequency and UTI history Musculoskeletal: positive for back pain, neck pain, and joint pain Neurological: positive for headaches   Blood pressure (!) 162/84, pulse (!) 52, temperature 98 F (36.7 Smith), temperature source Oral, resp. rate 12, height 5' 3" (1.6 m), weight 168 lb (76.2 kg), SpO2 92 %. Physical Exam Vitals reviewed.  Constitutional:      Appearance: Normal appearance.  HENT:     Head: Normocephalic.     Mouth/Throat:     Mouth: Mucous membranes are moist.  Eyes:     Extraocular Movements: Extraocular movements intact.     Pupils: Pupils are equal, round, and reactive to light.  Cardiovascular:     Rate and Rhythm: Normal rate and regular rhythm.  Pulmonary:     Effort: Pulmonary effort is normal.     Breath sounds: Normal breath sounds.  Chest:  Breasts:    Right: No swelling, inverted nipple, mass, nipple discharge or tenderness.     Left: No swelling, inverted nipple, mass, nipple discharge or tenderness.  Abdominal:     General: There is no distension.     Palpations: Abdomen is soft.     Tenderness: There is no  abdominal tenderness.  Musculoskeletal:     Cervical back: Normal range of motion.  Skin:    General: Skin is warm.  Neurological:     General: No focal deficit present.     Mental Status: She is alert and oriented to person, place, and time.  Psychiatric:        Mood and Affect: Mood normal.        Behavior: Behavior normal.        Thought Content: Thought content normal.        Results: CLINICAL DATA:  Left breast calcifications.   EXAM: RIGHT BREAST STEREOTACTIC CORE NEEDLE BIOPSY   COMPARISON:  Previous exam(s).   FINDINGS: The patient and I discussed the procedure of stereotactic-guided biopsy including benefits and alternatives. We discussed the high likelihood of a successful procedure. We discussed the risks of the procedure including infection, bleeding, tissue injury, clip migration, and inadequate sampling. Informed written consent was given. The usual time out protocol was performed immediately prior to the procedure.   Using sterile technique and 1% lidocaine and 1% lidocaine with epinephrine as local anesthetic, under stereotactic guidance, a 9 gauge vacuum assisted device was used to perform core needle biopsy of calcifications in the 12 o'clock region of the left breast using a superior to inferior approach. Specimen radiograph was performed showing calcifications are present in the tissue samples. Specimens with calcifications are identified for pathology.   Lesion quadrant: 12 o'clock   At the conclusion of the procedure, X shaped tissue marker clip was deployed into the biopsy cavity. Follow-up 2-view mammogram was performed and dictated separately.   IMPRESSION: Stereotactic-guided biopsy of the left breast. No apparent complications.   Electronically Signed: By: Sherry Smith M.D. 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 This was found to be concordant by Sherry Smith.   Pathology results were discussed with the patient by telephone. The patient reported doing well after the biopsy with minimal tenderness at the site. Post biopsy instructions and care were reviewed and questions were answered. The patient was encouraged to call [facility] for any additional concerns. My direct phone number was provided.   Surgical consultation has been arranged with Dr. Lindsey Tyeler Goedken at [facility] on November 25, 2021.   Pathology results reported by Lynne Bailey, RN on 11/11/2021.     Electronically Signed   By: Sherry Smith M.D.   On: 11/22/2021 08:21    Addended by Smith, Sherry L, Smith on 11/22/2021 10:21 AM  ADDENDUM REPORT: 11/10/2021 14:26   ADDENDUM: This is an addendum to stereotactic biopsy performed on 11/10/2021.   EXAM: LEFT BREAST STEREOTACTIC CORE NEEDLE BIOPSY.     Electronically Signed   By: Sherry Smith M.D.   On: 11/10/2021 14:26        Assessment & Plan:  Deneka W Vore is a 81 y.o. female with a left breast DCIS. This is high grade but given her age she would not need any sentinel node even for invasive cancer. We also discussed that given her age she may not require any radiation, but we will let her talk to Dr. Katragadda about this at a later day.  -Discussed radiofrequency tag placement followed by partial mastectomy   -Discussed risk of bleeding, infection, needing more surgery     All questions were answered to the satisfaction of the patient and family.         Aribella Vavra Smith Nimrit Kehres 11/29/2021, 2:24 PM     

## 2021-11-29 ENCOUNTER — Encounter: Payer: Self-pay | Admitting: General Surgery

## 2021-11-30 ENCOUNTER — Other Ambulatory Visit (HOSPITAL_COMMUNITY): Payer: Self-pay | Admitting: General Surgery

## 2021-11-30 DIAGNOSIS — D0512 Intraductal carcinoma in situ of left breast: Secondary | ICD-10-CM

## 2021-12-02 DIAGNOSIS — D6869 Other thrombophilia: Secondary | ICD-10-CM | POA: Diagnosis not present

## 2021-12-02 DIAGNOSIS — Z299 Encounter for prophylactic measures, unspecified: Secondary | ICD-10-CM | POA: Diagnosis not present

## 2021-12-02 DIAGNOSIS — D692 Other nonthrombocytopenic purpura: Secondary | ICD-10-CM | POA: Diagnosis not present

## 2021-12-02 DIAGNOSIS — I10 Essential (primary) hypertension: Secondary | ICD-10-CM | POA: Diagnosis not present

## 2021-12-02 DIAGNOSIS — C50912 Malignant neoplasm of unspecified site of left female breast: Secondary | ICD-10-CM | POA: Diagnosis not present

## 2021-12-13 NOTE — H&P (Signed)
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 female.  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    The patient has no history of any masses, lumps, bumps, nipple changes or discharge. She had menarche at age 81, and her first regnancy at age 79. She has a history of any family breast cancer in her sister in her 62s and a paternal aunt.  She underwent menopause at 39.  She had a previous biopsies in her 89s that came back benign. She has not had any chest radiation.       Past Medical History:  Diagnosis Date   Atrial fibrillation [facility])     Chronic headaches 11/29/2016           Past Surgical History:  Procedure Laterality Date   CATARACT EXTRACTION, BILATERAL       Ruptured ovary        left ovary in the 14s           Family History  Problem Relation Age of Onset   Breast cancer Sister     Breast cancer Paternal Aunt        Social History        Tobacco Use   Smoking status: Never   Smokeless tobacco: Never  Substance Use Topics   Alcohol use: No   Drug use: No      Medications: I have reviewed the patient's current medications. Allergies as of 11/25/2021         Reactions    Macrodantin [nitrofurantoin] Rash    Sulfa Antibiotics      Headaches.    Tetracyclines & Related      Yeast infection            Medication List           Accurate as of November 25, 2021 11:59 PM. If you have any questions, ask your nurse or doctor.              STOP taking these medications     amoxicillin-clavulanate 875-125 MG tablet Commonly known as: AUGMENTIN Stopped by: Virl Cagey, MD    butalbital-acetaminophen-caffeine 302 [id number] MG tablet Commonly known as: FIORICET Stopped by: Virl Cagey, MD    celecoxib 100 MG  capsule Commonly known as: CELEBREX Stopped by: Virl Cagey, MD    metoprolol tartrate 25 MG tablet Commonly known as: LOPRESSOR Stopped by: Virl Cagey, MD           TAKE these medications     Eliquis 5 MG Tabs tablet Generic drug: apixaban Take 5 mg by mouth 2 (two) times daily.    latanoprost 0.005 % ophthalmic solution Commonly known as: XALATAN SMARTSIG:1 Drop(s) In Eye(s) Every Evening    methenamine 1 g tablet Commonly known as: Hiprex Take 1 tablet (1 g total) by mouth 2 (two) times daily with a meal.    metoprolol succinate 25 MG 24 hr tablet Commonly known as: TOPROL-XL Take 12.5 mg by mouth 2 (two) times daily.    pantoprazole 40 MG tablet Commonly known as: PROTONIX Take 40 mg by mouth daily.               ROS:  A comprehensive review of systems was negative except for: Genitourinary: positive for frequency and UTI history Musculoskeletal: positive for back pain, neck pain, and joint pain Neurological: positive for headaches   Blood pressure (!) 162/84, pulse (!) 52, temperature 98 F (36.7 C), temperature source Oral, resp. rate 12, height '5\' 3"'$  (1.6 m), weight 168 lb (76.2 kg), SpO2 92 %. Physical Exam Vitals reviewed.  Constitutional:      Appearance: Normal appearance.  HENT:     Head: Normocephalic.     Mouth/Throat:     Mouth: Mucous membranes are moist.  Eyes:     Extraocular Movements: Extraocular movements intact.     Pupils: Pupils are equal, round, and reactive to light.  Cardiovascular:     Rate and Rhythm: Normal rate and regular rhythm.  Pulmonary:     Effort: Pulmonary effort is normal.     Breath sounds: Normal breath sounds.  Chest:  Breasts:    Right: No swelling, inverted nipple, mass, nipple discharge or tenderness.     Left: No swelling, inverted nipple, mass, nipple discharge or tenderness.  Abdominal:     General: There is no distension.     Palpations: Abdomen is soft.     Tenderness: There is no  abdominal tenderness.  Musculoskeletal:     Cervical back: Normal range of motion.  Skin:    General: Skin is warm.  Neurological:     General: No focal deficit present.     Mental Status: She is alert and oriented to person, place, and time.  Psychiatric:        Mood and Affect: Mood normal.        Behavior: Behavior normal.        Thought Content: Thought content normal.        Results: CLINICAL DATA:  Left breast calcifications.   EXAM: RIGHT BREAST STEREOTACTIC CORE NEEDLE BIOPSY   COMPARISON:  Previous exam(s).   FINDINGS: The patient and I discussed the procedure of stereotactic-guided biopsy including benefits and alternatives. We discussed the high likelihood of a successful procedure. We discussed the risks of the procedure including infection, bleeding, tissue injury, clip migration, and inadequate sampling. Informed written consent was given. The usual time out protocol was performed immediately prior to the procedure.   Using sterile technique and 1% lidocaine and 1% lidocaine with epinephrine as local anesthetic, under stereotactic guidance, a 9 gauge vacuum assisted device was used to perform core needle biopsy of calcifications in the 12 o'clock region of the left breast using a superior to inferior approach. Specimen radiograph was performed showing calcifications are present in the tissue samples. Specimens with calcifications are identified for pathology.   Lesion quadrant: 12 o'clock   At the conclusion of the procedure, X shaped tissue marker clip was deployed into the biopsy cavity. Follow-up 2-view mammogram was performed and dictated separately.   IMPRESSION: Stereotactic-guided biopsy of the left breast. No apparent complications.   Electronically Signed: By: Lillia Mountain M.D. 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 This was found to be concordant by Dr. Lillia Mountain.   Pathology results were discussed with the patient by telephone. The patient reported doing well after the biopsy with minimal tenderness at the site. Post biopsy instructions and care were reviewed and questions were answered. The patient was encouraged to call [facility] for any additional concerns. My direct phone number was provided.   Surgical consultation has been arranged with Dr. Blake Divine at [facility] on November 25, 2021.   Pathology results reported by Terie Purser, RN on 11/11/2021.     Electronically Signed   By: Lillia Mountain M.D.   On: 11/22/2021 08:21    Addended by Luvenia Redden, MD on 11/22/2021 10:21 AM  ADDENDUM REPORT: 11/10/2021 14:26   ADDENDUM: This is an addendum to stereotactic biopsy performed on 11/10/2021.   EXAM: LEFT BREAST STEREOTACTIC CORE NEEDLE BIOPSY.     Electronically Signed   By: Lillia Mountain M.D.   On: 11/10/2021 14:26        Assessment & Plan:  Sherry Smith is a 81 y.o. female with a left breast DCIS. This is high grade but given her age she would not need any sentinel node even for invasive cancer. We also discussed that given her age she may not require any radiation, but we will let her talk to Dr. Delton Coombes about this at a later day.  -Discussed radiofrequency tag placement followed by partial mastectomy   -Discussed risk of bleeding, infection, needing more surgery     All questions were answered to the satisfaction of the patient and family.         Virl Cagey 11/29/2021, 2:24 PM

## 2021-12-14 ENCOUNTER — Ambulatory Visit (HOSPITAL_COMMUNITY)
Admission: RE | Admit: 2021-12-14 | Discharge: 2021-12-14 | Disposition: A | Discharge status: Home / Self Care | Payer: Medicare Other | Source: Ambulatory Visit | Attending: General Surgery | Admitting: General Surgery

## 2021-12-14 DIAGNOSIS — D0512 Intraductal carcinoma in situ of left breast: Secondary | ICD-10-CM | POA: Insufficient documentation

## 2021-12-14 MED ORDER — LIDOCAINE HCL (PF) 2 % IJ SOLN
INTRAMUSCULAR | Status: AC
  Filled 2021-12-14: qty 10

## 2021-12-14 NOTE — Pre-Procedure Instructions (Signed)
Dr Tawny Asal and she wants patient to stop eliquis 2 days before procedure.

## 2021-12-14 NOTE — Patient Instructions (Signed)
Sherry Smith  12/14/2021     '@PREFPERIOPPHARMACY'$ @   Your procedure is scheduled on  12/17/2021.   Report to [facility] at  0600  A.M.   Call this number if you have problems the morning of surgery:  [phone]  If you experience any cold or flu symptoms such as cough, fever, chills, shortness of breath, etc. between now and your scheduled surgery, please notify us at the above number.   Remember:  Do not eat or drink after midnight.      Take these medicines the morning of surgery with A SIP OF WATER                                        metoprolol, protonix.    Do not wear jewelry, make-up or nail polish.  Do not wear lotions, powders, or perfumes, or deodorant.  Do not shave 48 hours prior to surgery.  Men may shave face and neck.  Do not bring valuables to the hospital.  [facility] is not responsible for any belongings or valuables.  Contacts, dentures or bridgework may not be worn into surgery.  Leave your suitcase in the car.  After surgery it may be brought to your room.  For patients admitted to the hospital, discharge time will be determined by your treatment team.  Patients discharged the day of surgery will not be allowed to drive home and must have someone with them for 24 hours.    Special instructions:   DO NOT smoke tobacco or vape for 24 hours before your procedure.  Please read over the following fact sheets that you were given. Coughing and Deep Breathing, Surgical Site Infection Prevention, Anesthesia Post-op Instructions, and Care and Recovery After Surgery      Breast Biopsy, Care After The following information offers guidance on how to care for yourself after your breast biopsy. Your doctor may also give you more specific instructions. If you have problems or questions, contact your doctor. What can I expect after the procedure? After a breast biopsy, it is common to have: Bruising on your breast. Breast swelling. Numbness,  tingling, or pain near your biopsy site. This site is where tissue was taken out for study. Follow these instructions at home: Medicines Take over-the-counter and prescription medicines only as told by your doctor. If you were given a sedative during your procedure, do not drive or use machines until your doctor says that it is safe. A sedative is a medicine that helps you relax. Do not drink alcohol while taking pain medicine. Ask your doctor if you should avoid driving or using machines while you are taking your medicine. Biopsy site care     Follow instructions from your doctor about how to take care of your cut from surgery (incision) or your puncture site. Make sure you: Wash your hands with soap and water for at least 20 seconds before and after you change your bandage. If you cannot use soap and water, use hand sanitizer. Change your bandage. Leave stitches or skin glue in place for at least 2 weeks. Leave tape strips alone unless you are told to take them off. You may trim the edges of the tape strips if they curl up. If you have stitches, keep them dry when you take a bath or a shower. Check your cut or puncture  site every day for signs of infection. Look for: More redness, swelling, or pain. More fluid or blood. Warmth. Pus or a bad smell. Protect the biopsy site. Do not let the site get bumped. Managing pain If told, put ice on the biopsy site. To do this: Put ice in a plastic bag. Place a towel between your skin and the bag. Leave the ice on for 20 minutes, 2-3 times a day. Take off the ice if your skin turns bright red. This is very important. If you cannot feel pain, heat, or cold, you have a greater risk of damage to the area. Activity If a cut was made in your skin to do the biopsy, avoid activities that could pull your cut open. These include: Stretching. Reaching over your head. Exercise. Sports. Lifting anything that weighs more than 3 lb (1.4 kg). Return to  your normal activities when your doctor says that it is safe. General instructions Follow your normal diet. Wear a good support bra for as long as told by your doctor. Get checked for extra fluid around your lymph nodes (lymphedema) as often as told. Do not smoke or use any products that contain nicotine or tobacco. If you need help quitting, ask your doctor. Keep all follow-up visits. Contact a doctor if: You notice any of these at or near the biopsy site: More redness, swelling, or pain. More fluid or blood. Warmth. Pus or a bad smell. The site breaking open after the stitches or skin tape strips have been removed. You have a rash or a fever. Get help right away if: You have trouble breathing. You have red streaks around the biopsy site. Summary After a breast biopsy, it is common to have bruising, numbness, tingling, or pain near your biopsy site. Ask your doctor if you should avoid driving or using machines while you are taking your medicine. If you had a cut made in your skin to do the biopsy, avoid activities that may pull the cut open. Return to your normal activities when your doctor says that it is safe. Wear a good support bra for as long as told by your doctor. This information is not intended to replace advice given to you by your health care provider. Make sure you discuss any questions you have with your health care provider. Document Revised: 11/25/2020 Document Reviewed: 11/25/2020 Elsevier Patient Education  [place] Anesthesia, Adult, Care After The following information offers guidance on how to care for yourself after your procedure. Your health care provider may also give you more specific instructions. If you have problems or questions, contact your health care provider. What can I expect after the procedure? After the procedure, it is common for people to: Have pain or discomfort at the IV site. Have nausea or vomiting. Have a sore throat or  hoarseness. Have trouble concentrating. Feel cold or chills. Feel weak, sleepy, or tired (fatigue). Have soreness and body aches. These can affect parts of the body that were not involved in surgery. Follow these instructions at home: For the time period you were told by your health care provider:  Rest. Do not participate in activities where you could fall or become injured. Do not drive or use machinery. Do not drink alcohol. Do not take sleeping pills or medicines that cause drowsiness. Do not make important decisions or sign legal documents. Do not take care of children on your own. General instructions Drink enough fluid to keep your urine pale yellow. If you have  sleep apnea, surgery and certain medicines can increase your risk for breathing problems. Follow instructions from your health care provider about wearing your sleep device: Anytime you are sleeping, including during daytime naps. While taking prescription pain medicines, sleeping medicines, or medicines that make you drowsy. Return to your normal activities as told by your health care provider. Ask your health care provider what activities are safe for you. Take over-the-counter and prescription medicines only as told by your health care provider. Do not use any products that contain nicotine or tobacco. These products include cigarettes, chewing tobacco, and vaping devices, such as e-cigarettes. These can delay incision healing after surgery. If you need help quitting, ask your health care provider. Contact a health care provider if: You have nausea or vomiting that does not get better with medicine. You vomit every time you eat or drink. You have pain that does not get better with medicine. You cannot urinate or have bloody urine. You develop a skin rash. You have a fever. Get help right away if: You have trouble breathing. You have chest pain. You vomit blood. These symptoms may be an emergency. Get help right  away. Call 911. Do not wait to see if the symptoms will go away. Do not drive yourself to the hospital. Summary After the procedure, it is common to have a sore throat, hoarseness, nausea, vomiting, or to feel weak, sleepy, or fatigue. For the time period you were told by your health care provider, do not drive or use machinery. Get help right away if you have difficulty breathing, have chest pain, or vomit blood. These symptoms may be an emergency. This information is not intended to replace advice given to you by your health care provider. Make sure you discuss any questions you have with your health care provider. Document Revised: 04/30/2021 Document Reviewed: 04/30/2021 Elsevier Patient Education  [place]. How to Use Chlorhexidine Before Surgery Chlorhexidine gluconate (CHG) is a germ-killing (antiseptic) solution that is used to clean the skin. It can get rid of the bacteria that normally live on the skin and can keep them away for about 24 hours. To clean your skin with CHG, you may be given: A CHG solution to use in the shower or as part of a sponge bath. A prepackaged cloth that contains CHG. Cleaning your skin with CHG may help lower the risk for infection: While you are staying in the intensive care unit of the hospital. If you have a vascular access, such as a central line, to provide short-term or long-term access to your veins. If you have a catheter to drain urine from your bladder. If you are on a ventilator. A ventilator is a machine that helps you breathe by moving air in and out of your lungs. After surgery. What are the risks? Risks of using CHG include: A skin reaction. Hearing loss, if CHG gets in your ears and you have a perforated eardrum. Eye injury, if CHG gets in your eyes and is not rinsed out. The CHG product catching fire. Make sure that you avoid smoking and flames after applying CHG to your skin. Do not use CHG: If you have a chlorhexidine  allergy or have previously reacted to chlorhexidine. On babies younger than 23 months of age. How to use CHG solution Use CHG only as told by your health care provider, and follow the instructions on the label. Use the full amount of CHG as directed. Usually, this is one bottle. During a shower Follow  these steps when using CHG solution during a shower (unless your health care provider gives you different instructions): Start the shower. Use your normal soap and shampoo to wash your face and hair. Turn off the shower or move out of the shower stream. Pour the CHG onto a clean washcloth. Do not use any type of brush or rough-edged sponge. Starting at your neck, lather your body down to your toes. Make sure you follow these instructions: If you will be having surgery, pay special attention to the part of your body where you will be having surgery. Scrub this area for at least 1 minute. Do not use CHG on your head or face. If the solution gets into your ears or eyes, rinse them well with water. Avoid your genital area. Avoid any areas of skin that have broken skin, cuts, or scrapes. Scrub your back and under your arms. Make sure to wash skin folds. Let the lather sit on your skin for 1-2 minutes or as long as told by your health care provider. Thoroughly rinse your entire body in the shower. Make sure that all body creases and crevices are rinsed well. Dry off with a clean towel. Do not put any substances on your body afterward--such as powder, lotion, or perfume--unless you are told to do so by your health care provider. Only use lotions that are recommended by the manufacturer. Put on clean clothes or pajamas. If it is the night before your surgery, sleep in clean sheets.  During a sponge bath Follow these steps when using CHG solution during a sponge bath (unless your health care provider gives you different instructions): Use your normal soap and shampoo to wash your face and hair. Pour the  CHG onto a clean washcloth. Starting at your neck, lather your body down to your toes. Make sure you follow these instructions: If you will be having surgery, pay special attention to the part of your body where you will be having surgery. Scrub this area for at least 1 minute. Do not use CHG on your head or face. If the solution gets into your ears or eyes, rinse them well with water. Avoid your genital area. Avoid any areas of skin that have broken skin, cuts, or scrapes. Scrub your back and under your arms. Make sure to wash skin folds. Let the lather sit on your skin for 1-2 minutes or as long as told by your health care provider. Using a different clean, wet washcloth, thoroughly rinse your entire body. Make sure that all body creases and crevices are rinsed well. Dry off with a clean towel. Do not put any substances on your body afterward--such as powder, lotion, or perfume--unless you are told to do so by your health care provider. Only use lotions that are recommended by the manufacturer. Put on clean clothes or pajamas. If it is the night before your surgery, sleep in clean sheets. How to use CHG prepackaged cloths Only use CHG cloths as told by your health care provider, and follow the instructions on the label. Use the CHG cloth on clean, dry skin. Do not use the CHG cloth on your head or face unless your health care provider tells you to. When washing with the CHG cloth: Avoid your genital area. Avoid any areas of skin that have broken skin, cuts, or scrapes. Before surgery Follow these steps when using a CHG cloth to clean before surgery (unless your health care provider gives you different instructions): Using the CHG cloth, vigorously  scrub the part of your body where you will be having surgery. Scrub using a back-and-forth motion for 3 minutes. The area on your body should be completely wet with CHG when you are done scrubbing. Do not rinse. Discard the cloth and let the area  air-dry. Do not put any substances on the area afterward, such as powder, lotion, or perfume. Put on clean clothes or pajamas. If it is the night before your surgery, sleep in clean sheets.  For general bathing Follow these steps when using CHG cloths for general bathing (unless your health care provider gives you different instructions). Use a separate CHG cloth for each area of your body. Make sure you wash between any folds of skin and between your fingers and toes. Wash your body in the following order, switching to a new cloth after each step: The front of your neck, shoulders, and chest. Both of your arms, under your arms, and your hands. Your stomach and groin area, avoiding the genitals. Your right leg and foot. Your left leg and foot. The back of your neck, your back, and your buttocks. Do not rinse. Discard the cloth and let the area air-dry. Do not put any substances on your body afterward--such as powder, lotion, or perfume--unless you are told to do so by your health care provider. Only use lotions that are recommended by the manufacturer. Put on clean clothes or pajamas. Contact a health care provider if: Your skin gets irritated after scrubbing. You have questions about using your solution or cloth. You swallow any chlorhexidine. Call your local poison control center (1-206 166 5965 in the [place] Get help right away if: Your eyes itch badly, or they become very red or swollen. Your skin itches badly and is red or swollen. Your hearing changes. You have trouble seeing. You have swelling or tingling in your mouth or throat. You have trouble breathing. These symptoms may represent a serious problem that is an emergency. Do not wait to see if the symptoms will go away. Get medical help right away. Call your local emergency services (911 in the [place] Do not drive yourself to the hospital. Summary Chlorhexidine gluconate (CHG) is a germ-killing (antiseptic) solution that is used  to clean the skin. Cleaning your skin with CHG may help to lower your risk for infection. You may be given CHG to use for bathing. It may be in a bottle or in a prepackaged cloth to use on your skin. Carefully follow your health care provider's instructions and the instructions on the product label. Do not use CHG if you have a chlorhexidine allergy. Contact your health care provider if your skin gets irritated after scrubbing. This information is not intended to replace advice given to you by your health care provider. Make sure you discuss any questions you have with your health care provider. Document Revised: 05/31/2021 Document Reviewed: 04/13/2020 Elsevier Patient Education  [place].

## 2021-12-15 ENCOUNTER — Encounter (HOSPITAL_COMMUNITY): Payer: Self-pay

## 2021-12-15 ENCOUNTER — Encounter (HOSPITAL_COMMUNITY)
Admission: RE | Admit: 2021-12-15 | Discharge: 2021-12-15 | Disposition: A | Discharge status: Home / Self Care | Payer: Medicare Other | Source: Ambulatory Visit | Attending: General Surgery | Admitting: General Surgery

## 2021-12-15 VITALS — BP 157/71 | HR 64 | Temp 98.0°F | Resp 18 | Ht 63.0 in | Wt 168.0 lb

## 2021-12-15 DIAGNOSIS — I480 Paroxysmal atrial fibrillation: Secondary | ICD-10-CM | POA: Insufficient documentation

## 2021-12-15 DIAGNOSIS — Z01810 Encounter for preprocedural cardiovascular examination: Secondary | ICD-10-CM | POA: Diagnosis not present

## 2021-12-15 HISTORY — DX: Unspecified osteoarthritis, unspecified site: M19.90

## 2021-12-15 HISTORY — DX: Cardiac arrhythmia, unspecified: I49.9

## 2021-12-17 ENCOUNTER — Ambulatory Visit (HOSPITAL_COMMUNITY): Payer: Medicare Other

## 2021-12-17 ENCOUNTER — Ambulatory Visit (HOSPITAL_COMMUNITY): Payer: Medicare Other | Admitting: Anesthesiology

## 2021-12-17 ENCOUNTER — Encounter (HOSPITAL_COMMUNITY)
Admission: RE | Disposition: A | Discharge status: Home / Self Care | Payer: Self-pay | Source: Home / Self Care | Attending: General Surgery

## 2021-12-17 ENCOUNTER — Ambulatory Visit (HOSPITAL_BASED_OUTPATIENT_CLINIC_OR_DEPARTMENT_OTHER): Payer: Medicare Other | Admitting: Anesthesiology

## 2021-12-17 ENCOUNTER — Encounter (HOSPITAL_COMMUNITY): Payer: Self-pay | Admitting: General Surgery

## 2021-12-17 ENCOUNTER — Ambulatory Visit (HOSPITAL_COMMUNITY)
Admission: RE | Admit: 2021-12-17 | Discharge: 2021-12-17 | Disposition: A | Discharge status: Home / Self Care | Payer: Medicare Other | Attending: General Surgery | Admitting: General Surgery

## 2021-12-17 DIAGNOSIS — Z170 Estrogen receptor positive status [ER+]: Secondary | ICD-10-CM | POA: Insufficient documentation

## 2021-12-17 DIAGNOSIS — R920 Mammographic microcalcification found on diagnostic imaging of breast: Secondary | ICD-10-CM | POA: Diagnosis not present

## 2021-12-17 DIAGNOSIS — M1990 Unspecified osteoarthritis, unspecified site: Secondary | ICD-10-CM | POA: Diagnosis not present

## 2021-12-17 DIAGNOSIS — I4891 Unspecified atrial fibrillation: Secondary | ICD-10-CM | POA: Diagnosis not present

## 2021-12-17 DIAGNOSIS — I10 Essential (primary) hypertension: Secondary | ICD-10-CM | POA: Insufficient documentation

## 2021-12-17 DIAGNOSIS — N641 Fat necrosis of breast: Secondary | ICD-10-CM | POA: Diagnosis not present

## 2021-12-17 DIAGNOSIS — R519 Headache, unspecified: Secondary | ICD-10-CM | POA: Insufficient documentation

## 2021-12-17 DIAGNOSIS — D0512 Intraductal carcinoma in situ of left breast: Secondary | ICD-10-CM

## 2021-12-17 DIAGNOSIS — K219 Gastro-esophageal reflux disease without esophagitis: Secondary | ICD-10-CM | POA: Insufficient documentation

## 2021-12-17 DIAGNOSIS — R0602 Shortness of breath: Secondary | ICD-10-CM | POA: Diagnosis not present

## 2021-12-17 DIAGNOSIS — R928 Other abnormal and inconclusive findings on diagnostic imaging of breast: Secondary | ICD-10-CM | POA: Diagnosis not present

## 2021-12-17 HISTORY — PX: BREAST LUMPECTOMY: SHX2

## 2021-12-17 SURGERY — PARTIAL MASTECTOMY WITH RADIO FREQUENCY LOCALIZER
Anesthesia: General | Site: Breast | Laterality: Left

## 2021-12-17 MED ORDER — CEFAZOLIN SODIUM-DEXTROSE 2-4 GM/100ML-% IV SOLN
2.0000 g | INTRAVENOUS | Status: AC
  Administered 2021-12-17: 2 g via INTRAVENOUS

## 2021-12-17 MED ORDER — PHENYLEPHRINE 80 MCG/ML (10ML) SYRINGE FOR IV PUSH (FOR BLOOD PRESSURE SUPPORT)
PREFILLED_SYRINGE | INTRAVENOUS | Status: DC | PRN
  Administered 2021-12-17 (×2): 160 ug via INTRAVENOUS
  Administered 2021-12-17: 80 ug via INTRAVENOUS

## 2021-12-17 MED ORDER — LIDOCAINE HCL (PF) 2 % IJ SOLN
INTRAMUSCULAR | Status: AC
  Filled 2021-12-17: qty 5

## 2021-12-17 MED ORDER — ORAL CARE MOUTH RINSE: 15.0000 mL | Freq: Once | OROMUCOSAL | Status: AC

## 2021-12-17 MED ORDER — ONDANSETRON HCL 4 MG PO TABS: 4.0000 mg | ORAL_TABLET | Freq: Three times a day (TID) | ORAL | 1 refills | Status: DC | PRN

## 2021-12-17 MED ORDER — BUPIVACAINE HCL (PF) 0.5 % IJ SOLN
INTRAMUSCULAR | Status: DC | PRN
  Administered 2021-12-17: 20 mL

## 2021-12-17 MED ORDER — PROPOFOL 500 MG/50ML IV EMUL
INTRAVENOUS | Status: AC
  Filled 2021-12-17: qty 50

## 2021-12-17 MED ORDER — OXYCODONE HCL 5 MG PO TABS: 5.0000 mg | ORAL_TABLET | ORAL | 0 refills | Status: DC | PRN

## 2021-12-17 MED ORDER — PHENYLEPHRINE 80 MCG/ML (10ML) SYRINGE FOR IV PUSH (FOR BLOOD PRESSURE SUPPORT)
PREFILLED_SYRINGE | INTRAVENOUS | Status: AC
  Filled 2021-12-17: qty 30

## 2021-12-17 MED ORDER — ONDANSETRON HCL 4 MG/2ML IJ SOLN
INTRAMUSCULAR | Status: AC
  Filled 2021-12-17: qty 2

## 2021-12-17 MED ORDER — CEFAZOLIN SODIUM-DEXTROSE 2-4 GM/100ML-% IV SOLN
INTRAVENOUS | Status: AC
  Filled 2021-12-17: qty 100

## 2021-12-17 MED ORDER — CHLORHEXIDINE GLUCONATE 0.12 % MT SOLN
15.0000 mL | Freq: Once | OROMUCOSAL | Status: AC
  Administered 2021-12-17: 15 mL via OROMUCOSAL

## 2021-12-17 MED ORDER — BUPIVACAINE HCL (PF) 0.5 % IJ SOLN
INTRAMUSCULAR | Status: AC
  Filled 2021-12-17: qty 30

## 2021-12-17 MED ORDER — CHLORHEXIDINE GLUCONATE CLOTH 2 % EX PADS: 6.0000 | MEDICATED_PAD | Freq: Once | CUTANEOUS | Status: DC

## 2021-12-17 MED ORDER — LACTATED RINGERS IV SOLN: INTRAVENOUS | Status: DC

## 2021-12-17 MED ORDER — HYDROMORPHONE HCL 1 MG/ML IJ SOLN: 0.2500 mg | INTRAMUSCULAR | Status: DC | PRN

## 2021-12-17 MED ORDER — 0.9 % SODIUM CHLORIDE (POUR BTL) OPTIME
TOPICAL | Status: DC | PRN
  Administered 2021-12-17: 1000 mL

## 2021-12-17 MED ORDER — PROPOFOL 10 MG/ML IV BOLUS
INTRAVENOUS | Status: DC | PRN
  Administered 2021-12-17: 120 mg via INTRAVENOUS

## 2021-12-17 MED ORDER — DEXAMETHASONE SODIUM PHOSPHATE 10 MG/ML IJ SOLN
INTRAMUSCULAR | Status: DC | PRN
  Administered 2021-12-17: 10 mg via INTRAVENOUS

## 2021-12-17 MED ORDER — ONDANSETRON HCL 4 MG/2ML IJ SOLN
INTRAMUSCULAR | Status: DC | PRN
  Administered 2021-12-17: 4 mg via INTRAVENOUS

## 2021-12-17 MED ORDER — ONDANSETRON HCL 4 MG/2ML IJ SOLN: 4.0000 mg | Freq: Once | INTRAMUSCULAR | Status: DC | PRN

## 2021-12-17 MED ORDER — LIDOCAINE 2% (20 MG/ML) 5 ML SYRINGE
INTRAMUSCULAR | Status: DC | PRN
  Administered 2021-12-17: 80 mg via INTRAVENOUS

## 2021-12-17 MED ORDER — DEXAMETHASONE SODIUM PHOSPHATE 10 MG/ML IJ SOLN
INTRAMUSCULAR | Status: AC
  Filled 2021-12-17: qty 1

## 2021-12-17 MED ORDER — FENTANYL CITRATE (PF) 100 MCG/2ML IJ SOLN
INTRAMUSCULAR | Status: AC
  Filled 2021-12-17: qty 2

## 2021-12-17 MED ORDER — FENTANYL CITRATE (PF) 100 MCG/2ML IJ SOLN
INTRAMUSCULAR | Status: DC | PRN
  Administered 2021-12-17 (×4): 25 ug via INTRAVENOUS

## 2021-12-17 SURGICAL SUPPLY — 32 items
ADH SKN CLS APL DERMABOND .7 (GAUZE/BANDAGES/DRESSINGS) ×1
APL PRP STRL LF DISP 70% ISPRP (MISCELLANEOUS) ×1
CHLORAPREP W/TINT 26 (MISCELLANEOUS) ×2 IMPLANT
CLOTH BEACON ORANGE TIMEOUT ST (SAFETY) ×2 IMPLANT
COVER LIGHT HANDLE STERIS (MISCELLANEOUS) ×4 IMPLANT
DECANTER SPIKE VIAL GLASS SM (MISCELLANEOUS) ×2 IMPLANT
DERMABOND ADVANCED .7 DNX12 (GAUZE/BANDAGES/DRESSINGS) ×2 IMPLANT
DEVICE DUBIN W/COMP PLATE 8390 (MISCELLANEOUS) ×2 IMPLANT
ELECT REM PT RETURN 9FT ADLT (ELECTROSURGICAL) ×1
ELECTRODE REM PT RTRN 9FT ADLT (ELECTROSURGICAL) ×2 IMPLANT
GLOVE BIO SURGEON STRL SZ 6.5 (GLOVE) ×2 IMPLANT
GLOVE BIOGEL PI IND STRL 6.5 (GLOVE) ×2 IMPLANT
GLOVE BIOGEL PI IND STRL 7.0 (GLOVE) ×4 IMPLANT
GLOVE BIOGEL PI IND STRL 7.5 (GLOVE) IMPLANT
GOWN STRL REUS W/TWL LRG LVL3 (GOWN DISPOSABLE) ×4 IMPLANT
KIT MARKER MARGIN INK (KITS) ×2 IMPLANT
KIT TURNOVER KIT A (KITS) ×2 IMPLANT
MANIFOLD NEPTUNE II (INSTRUMENTS) ×2 IMPLANT
NDL HYPO 25X1 1.5 SAFETY (NEEDLE) ×2 IMPLANT
NEEDLE HYPO 25X1 1.5 SAFETY (NEEDLE) ×1 IMPLANT
NS IRRIG 1000ML POUR BTL (IV SOLUTION) ×2 IMPLANT
PACK MINOR (CUSTOM PROCEDURE TRAY) ×2 IMPLANT
PAD ARMBOARD 7.5X6 YLW CONV (MISCELLANEOUS) ×2 IMPLANT
SET BASIN LINEN APH (SET/KITS/TRAYS/PACK) ×2 IMPLANT
SET LOCALIZER 20 PROBE US (MISCELLANEOUS) ×2 IMPLANT
SPONGE T-LAP 18X18 ~~LOC~~+RFID (SPONGE) ×2 IMPLANT
SUT MNCRL AB 4-0 PS2 18 (SUTURE) ×2 IMPLANT
SUT SILK 0 FSL (SUTURE) ×2 IMPLANT
SUT VIC AB 3-0 SH 27 (SUTURE) ×1
SUT VIC AB 3-0 SH 27X BRD (SUTURE) ×2 IMPLANT
SUT VIC AB 4-0 PS2 27 (SUTURE) ×2 IMPLANT
SYR CONTROL 10ML LL (SYRINGE) ×2 IMPLANT

## 2021-12-17 NOTE — Anesthesia Procedure Notes (Signed)
Procedure Name: LMA Insertion Date/Time: 12/17/2021 7:41 AM  Performed by: Genelle Bal, CRNAPre-anesthesia Checklist: Patient identified, Emergency Drugs available, Suction available and Patient being monitored Patient Re-evaluated:Patient Re-evaluated prior to induction Oxygen Delivery Method: Circle system utilized Preoxygenation: Pre-oxygenation with 100% oxygen Induction Type: IV induction Ventilation: Mask ventilation without difficulty LMA: LMA inserted LMA Size: 4.0 Number of attempts: 1 Airway Equipment and Method: Bite block Placement Confirmation: positive ETCO2 Tube secured with: Tape Dental Injury: Teeth and Oropharynx as per pre-operative assessment

## 2021-12-17 NOTE — Transfer of Care (Signed)
Immediate Anesthesia Transfer of Care Note  Patient: Sherry Smith  Procedure(s) Performed: PARTIAL MASTECTOMY WITH RADIO FREQUENCY LOCALIZER, LEFT (Left: Breast)  Patient Location: PACU  Anesthesia Type:General  Level of Consciousness: awake, alert , and oriented  Airway & Oxygen Therapy: Patient Spontanous Breathing and Patient connected to face mask oxygen  Post-op Assessment: Report given to RN and Post -op Vital signs reviewed and stable  Post vital signs: Reviewed and stable  Last Vitals:  Vitals Value Taken Time  BP 157/87   Temp    Pulse 67   Resp 14   SpO2 98%     Last Pain:  Vitals:   12/17/21 0635  PainSc: 0-No pain         Complications: No notable events documented.

## 2021-12-17 NOTE — Anesthesia Preprocedure Evaluation (Signed)
Anesthesia Evaluation  Patient identified by MRN, date of birth, ID band Patient awake    Reviewed: Allergy & Precautions, H&P , NPO status , Patient's Chart, lab work & pertinent test results, reviewed documented beta blocker date and time   Airway Mallampati: II  TM Distance: >3 FB Neck ROM: Full    Dental  (+) Dental Advisory Given Crowns :   Pulmonary shortness of breath and with exertion   Pulmonary exam normal breath sounds clear to auscultation       Cardiovascular Exercise Tolerance: Good METS: 3 - Mets hypertension, Pt. on medications and Pt. on home beta blockers Normal cardiovascular exam+ dysrhythmias Atrial Fibrillation  Rhythm:Regular Rate:Normal     Neuro/Psych  Headaches  negative psych ROS   GI/Hepatic Neg liver ROS,GERD  Medicated and Controlled,,  Endo/Other  negative endocrine ROS    Renal/GU negative Renal ROS  negative genitourinary   Musculoskeletal  (+) Arthritis , Osteoarthritis,    Abdominal   Peds negative pediatric ROS (+)  Hematology negative hematology ROS (+)   Anesthesia Other Findings   Reproductive/Obstetrics negative OB ROS                             Anesthesia Physical Anesthesia Plan  ASA: 3  Anesthesia Plan: General   Post-op Pain Management: Dilaudid IV   Induction: Intravenous  PONV Risk Score and Plan: 4 or greater and Ondansetron and Dexamethasone  Airway Management Planned: LMA  Additional Equipment:   Intra-op Plan:   Post-operative Plan: Extubation in OR  Informed Consent: I have reviewed the patients History and Physical, chart, labs and discussed the procedure including the risks, benefits and alternatives for the proposed anesthesia with the patient or authorized representative who has indicated his/her understanding and acceptance.     Dental advisory given  Plan Discussed with: CRNA and Surgeon  Anesthesia Plan  Comments:        Anesthesia Quick Evaluation

## 2021-12-17 NOTE — Discharge Instructions (Addendum)
Discharge instructions after breast surgery:   Common Complaints: Pain and bruising at the incision sites.  Swelling at the incision sites. Stiffness of the arm.   Diet/ Activity: Restart Eliquis on Monday 12/20/21 as long as no major bruising noted.  Diet as tolerated.  You may shower but do not take hot showers as this can disrupt the glue. Rest and listen to your body, but do not remain in bed all day.  Walk everyday for at least 15-20 minutes. Deep cough and move around every 1-2 hours in the first few days after surgery.  Do not lift > 10 lbs for the first 2 weeks after surgery. Do not do anything that makes you feel like you are putting unnecessary pull or stretch on the incision sites.  Do move your arm and shoulder (see exercises options below). If you do not move then you can get stiff and hurt more.  Do not pick at the dermabond glue on your incision sites.  This glue film will remain in place for 1-2 weeks and will start to peel off.  Do not place lotions or balms on your incision unless instructed to specifically by Dr. Constance Haw.   Pain Expectations and Narcotics: -After surgery you will have pain associated with your incisions and this is normal. The pain is muscular and nerve pain, and will get better with time. -You are encouraged and expected to take non narcotic medications like tylenol and ibuprofen (when able) to treat pain as multiple modalities can aid with pain treatment. -Narcotics are only used when pain is severe or there is breakthrough pain. -You are not expected to have a pain score of 0 after surgery, as we cannot prevent pain. A pain score of 3-4 that allows you to be functional, move, walk, and tolerate some activity is the goal. The pain will continue to improve over the days after surgery and is dependent on your surgery. -Due to [place] law, we are only able to give a certain amount of pain medication to treat post operative pain, and we only give additional  narcotics on a patient by patient basis.  -For most laparoscopic surgery, studies have shown that the majority of patients only need 10-15 narcotic pills, and for open surgeries most patients only need 15-20.   -Having appropriate expectations of pain and knowledge of pain management with non narcotics is important as we do not want anyone to become addicted to narcotic pain medication.  -Using ice packs in the first 48 hours and heating pads after 48 hours, wearing an abdominal binder (when recommended), and using over the counter medications are all ways to help with pain management.   -Simple acts like meditation and mindfulness practices after surgery can also help with pain control and research has proven the benefit of these practices.  Medication: Tylenol '1000mg'$  @ 6am, 12noon, 6pm, 84mdnight (Do not exceed '4000mg'$  of tylenol a day).  Take Roxicodone for breakthrough pain every 4 hours.  Take Colace for constipation related to narcotic pain medication. If you do not have a bowel movement in 2 days, take Miralax over the counter.  Drink plenty of water to also prevent constipation.   Contact Information: If you have questions or concerns, please call our office, [phone] Monday- Thursday 8AM-5PM and Friday 8AM-12Noon.  If it is after hours or on the weekend, please call [facility], [phone] and ask to speak to the surgeon on call for Dr. [facility]stance [facility]   Exercises  After Breast Surgery Do at least a few of the exercises below twice a day. It is ok to start the day after surgery and gradually build up the amount and type of exercises you do. Link to the exercises with pictures ([email or web address]).   Deep Breathing Exercise Deep breathing can help you relax and ease discomfort and tightness around your incision (surgical cut). It's also a very good way to relieve stress during the  day.  Sit comfortably in a chair. Take a slow, deep breath through your nose. Let your chest and belly expand. Breathe out slowly through your mouth. Repeat as many times as needed.  Arm and Shoulder Exercises Doing arm and shoulder exercises will help you get back your full range of motion on your affected side (the side where you had your surgery). With full range of motion, you'll be able to: Move your arm over your head and out to the side Move your arm behind your neck Move your arm to the middle of your back Do each of the exercises below 5 times a day. Keep doing this until you have a full range of motion again and can use your arm as you did before surgery in all your normal activities. This includes activities at work, at home, and in recreation or sports. If you had limited movement in your arm before surgery, your goal will be to get back as much movement as you had before.  If you get your full range of motion back quickly, keep doing these exercises once a day instead of 5 times a day. This is especially true if you feel any tightness in your chest, shoulder, or under your affected arm. These exercises can help keep scar tissue from forming in your armpit and shoulder. Scar tissue can limit your arm movements later.  If you still have trouble moving your shoulder 4 weeks after your surgery, tell your surgeon. They'll tell you if you need more rehabilitation, such as physical or occupational therapy.  If you had one of the following surgeries, you can do the following set of exercises on the first day after your surgery, as long as your surgeon tells you it's safe.  Shoulder rolls The shoulder roll is a good exercise to start with because it gently stretches your chest and shoulder muscles.  Stand or sit comfortably with your arms relaxed at your sides. Start with backward shoulder rolls. In a circular motion, bring your shoulders forward, up, backward, and down. Do this 10  times. Switch directions and do 10 forward shoulder rolls. Bring your shoulders backward, up, forward, and down. Do this 10 times. Try to make the circles as big as you can and move both shoulders at the same time. If you have some tightness across your incision or chest, start with smaller circles and make them bigger as the tightness decreases. The backward direction might feel a little tighter across your chest than the forward direction. This will get better with practice.  Shoulder wings The shoulder wings exercise will help you get back outward movement of your shoulder. You can do this exercise while sitting or standing.  Place your hands on your chest or collarbone. Raise your elbows out to the side, limiting your range of motion as instructed by your healthcare team. Slowly lower your elbows. Do this 10 times. Then, slowly lower your hands. If you feel discomfort while doing this exercise, hold your position and do the deep breathing exercise. If the discomfort passes,  raise your elbows a little higher. If it doesn't pass, don't raise your elbows any higher. Finish the exercise raising your elbows only high enough to feel a gentle stretch and no discomfort.  Arm circles If you had surgery on both breasts, do this exercise with both arms, 1 arm at a time. Don't do this exercise with both arms at the same time. This will put too much pressure on your chest.  Stand with your feet slightly apart for balance. Raise your affected arm out to the side as high as you can, limiting your range of movement as instructed by your healthcare team. Start making slow, backward circles in the air with your arm. Make sure you're moving your arm from your shoulder, not your elbow. Keep your elbow straight. Increase the size of the circles until they're as big as you can comfortably make them, limiting your range of motion as instructed by your healthcare team. If you feel any aching or if your arm is tired,  take a break. Keep doing the exercise when you feel better. Do 10 full backward circles. Then, slowly lower your arm to your side. Rest your arm for a moment. Follow steps 1 to 4 again, but this time make slow, forward circles.  W exercise You can do the W exercise while sitting or standing.  Form a "W" with your arms out to the side and palms facing forward (see Figure 4). Try to bring your hands up so they're even with your face. If you can't raise your arms that high, bring them to the highest comfortable position. Make sure to limit your range of motion as instructed by your healthcare team. Pinch your shoulder blades together and downward, as if you're squeezing a pencil between them. If you feel discomfort, stop at that position and do the deep breathing exercise. If the discomfort passes, try to bring your arms back a little further. If it doesn't pass, don't reach any further. Hold the furthest position that doesn't cause discomfort. Squeeze your shoulder blades together and downward for 5 seconds. Slowly bring your arms back down to the starting position. Repeat this movement 10 times.  Back Climb You can do the back climb stretch while sitting or standing. You'll need a timer or stopwatch.  Place your hands behind your back. Hold the hand on your affected side with your other hand. If you had surgery on both breasts, use the arm that moves most easily to hold the other. Slowly slide your hands up the center of your back as far as you can. If you feel tightness near your incision, stop at that position and do the deep breathing exercise. If the tightness decreases, try to slide your hands up a little further. If it doesn't decrease, don't slide your hands up any further. Hold the highest position you can for 1 minute. Use your stopwatch or timer to keep track. You should feel a gentle stretch in your shoulder area. After 1 minute, slowly lower your hands.  Hands behind neck You can do  the hands behind neck stretch while sitting or standing. You'll need a timer or stopwatch.  Clasp your hands together on your lap or in front of you. Slowly raise your hands toward your head, keeping your elbows together in front of you, not out to the sides. Keep your head level. Don't bend your neck or head forward. Slide your hands over your head until you reach the back of your neck. When you  get to this point, spread your elbows out to the sides. Hold this position for 1 minute. Use your stopwatch or timer to keep track. Breathe normally. Don't hold your breath as you stretch your body. If you have some tightness across your incision or chest, hold your position and do the deep breathing exercise. If the tightness decreases, continue with the movement. If the tightness stays the same, reach up and stretch your elbows back as best as you can without causing discomfort. Hold the position you're most comfortable in for 1 minute. Slowly come out of the stretch by bringing your elbows together and sliding your hands over your head. Then, slowly lower your arms.  Forward wall crawls You'll need 2 pieces of tape for the forward wall crawl exercise.  Stand facing a wall. Your toes should be about 6 inches (15 centimeters) from the wall. Reach as high as you can with your unaffected arm. Elta Gu[place] that point with a piece of tape. This will be the goal for your affected arm. If you had surgery on both breasts, set your goal using the arm that moves most comfortably. Place both hands against the wall at a level that's comfortable. Crawl your fingers up the wall as far as you can, keeping them even with each other.. Try not to look up toward your hands or arch your back. When you get to the point where you feel a good stretch, but not pain, do the deep breathing exercise. Return to the starting position by crawling your fingers back down the wall. Repeat the wall crawl 10 times. Each time you raise your hands,  try to crawl a little bit higher. On the 10th crawl, use the other piece of tape to mark the highest point you reached with your affected arm. This will let you to see your progress each time you do this exercise. As you become more flexible, you may need to take a step closer to the wall so you can reach a little higher.   Side wall crawls You'll also need 2 pieces of tape for the side wall crawl exercise.  You shouldn't feel pain while doing this exercise. It's normal to feel some tightness or pulling across the side of your chest. Focus on your breathing until the tightness decreases. Breathe normally throughout this exercise. Don't hold your breath.  Be careful not to turn your body toward the wall while doing this exercise. Make sure only the side of your body faces the wall.  If you had surgery on both breasts, start with step 3.  Stand with your unaffected side closest to the wall, about 1 foot (30.5 centimeters) away from the wall. Reach as high as you can with your unaffected arm. Elta [place] that point with a piece of tape (see Figure 8). This will be the goal for your affected arm. Turn your body so your affected side is now closest to the wall. If you had surgery on both breasts, start with either side closest to the wall. Crawl your fingers up the wall as far as you can. When you get to the point where you feel a good stretch, but not pain, do the deep breathing exercise. Return to the starting position by crawling your fingers back down the wall. Repeat this exercise 10 times. On your 10th crawl, use a piece of tape to mark the highest point you reached with your affected arm. This will let you see your progress each time you do the exercise.  If you had surgery on both breasts, repeat the exercise with your other arm.

## 2021-12-17 NOTE — Anesthesia Postprocedure Evaluation (Signed)
Anesthesia Post Note  Patient: Sherry Smith  Procedure(s) Performed: PARTIAL MASTECTOMY WITH RADIO FREQUENCY LOCALIZER, LEFT (Left: Breast)  Patient location during evaluation: Phase II Anesthesia Type: General Level of consciousness: awake and alert and oriented Pain management: pain level controlled Vital Signs Assessment: post-procedure vital signs reviewed and stable Respiratory status: spontaneous breathing, nonlabored ventilation and respiratory function stable Cardiovascular status: blood pressure returned to baseline and stable Postop Assessment: no apparent nausea or vomiting Anesthetic complications: no   No notable events documented.   Last Vitals:  Vitals:   12/17/21 0918 12/17/21 0929  BP: (!) 165/85   Pulse: (!) 58 (!) 59  Resp: 20 18  Temp:  (!) 36.4 C  SpO2: 96% 98%    Last Pain:  Vitals:   12/17/21 0929  TempSrc: Oral  PainSc: 2                  Lash Matulich C Yennifer Segovia

## 2021-12-17 NOTE — Progress Notes (Signed)
[facility]  Updated her daughter. Rx to [facility]. Will see 11/16 and restart eliquis 11/6 Monday as long as no major bruising.  Curlene Labrum, MD [facility] [address] [place], [place] [address] [phone] (office)

## 2021-12-17 NOTE — Op Note (Signed)
[facility] Operative Note  12/17/21  Preoperative Diagnosis: Left breast DCIS    Postoperative Diagnosis: Same   Procedure(s) Performed: Left breast partial mastectomy after radiofrequency tag placement    Surgeon: Lanell Matar. Constance Haw, MD   Assistants: No qualified resident was available    Anesthesia: General endotracheal   Anesthesiologist: Denese Killings, MD    Specimens: Left breast mass painted    Estimated Blood Loss: Minimal   Blood Replacement: None    Complications: None   Wound Class: Clean    Operative Indications: Ms. Osment is a 81 yo who has a DCIS of the left breast. We discussed excision and risk of bleeding, infection, needing more surgery or finding invasive cancer.   Findings: Tag and clip in specimen mammogram    Procedure: The patient was taken to the operating room and placed supine. General endotracheal anesthesia was induced. Intravenous antibiotics were administered per protocol.  The left breast was prepared and draped in the usual sterile fashion. The images from her tag placement were reviewed.   Using the Localizer, the upper 12 o'clock portion of the breast demonstrated the shortest distance. A curvilinear incision was made. This was carried down to the breast tissue and flaps were created. The breast tissue around the tag and clip was excised with cautery, taking care to leave adequate margins around the area of interest. The specimen was removed, the tag was confirmed to be inside and the closet margin was 1cm.  The specimen was painted in the standard fashion. The specimen was sent to mammogram and the mammogram demonstrated both the clip and tag in the image.   The cavity was made hemostatic. The breast tissue flaps were closed with interrupted 3-0 Vicryl, leaving the deeper cavity open for seroma formation for cosmesis.  The skin was closed with a subcuticular 4-0 monocryl and dermabond.   Final inspection revealed  acceptable hemostasis. All counts were correct at the end of the case. The patient was awakened from anesthesia and extubated without complication.  The patient went to the PACU in stable condition.   Curlene Labrum, MD [facility] [address] [place],  [id number] [phone] (office)

## 2021-12-17 NOTE — Interval H&P Note (Signed)
History and Physical Interval Note:  12/17/2021 7:22 AM  Sherry Smith  has presented today for surgery, with the diagnosis of DCIS, L BREAST.  The various methods of treatment have been discussed with the patient and family. After consideration of risks, benefits and other options for treatment, the patient has consented to  Procedure(s): PARTIAL MASTECTOMY WITH RADIO FREQUENCY LOCALIZER, LEFT (Left) as a surgical intervention.  The patient's history has been reviewed, patient examined, no change in status, stable for surgery.  I have reviewed the patient's chart and labs.  Questions were answered to the patient's satisfaction.    Marked.  Virl Cagey

## 2021-12-20 LAB — SURGICAL PATHOLOGY

## 2021-12-23 NOTE — Progress Notes (Signed)
Let patient know all of the DCIS is removed. Margins are negative.

## 2021-12-30 ENCOUNTER — Encounter: Payer: Self-pay | Admitting: General Surgery

## 2021-12-30 ENCOUNTER — Ambulatory Visit (INDEPENDENT_AMBULATORY_CARE_PROVIDER_SITE_OTHER): Payer: Medicare Other | Admitting: General Surgery

## 2021-12-30 ENCOUNTER — Other Ambulatory Visit: Payer: Self-pay | Admitting: *Deleted

## 2021-12-30 VITALS — BP 162/84 | HR 63 | Temp 98.4°F | Resp 12 | Ht 63.0 in | Wt 168.0 lb

## 2021-12-30 DIAGNOSIS — D0512 Intraductal carcinoma in situ of left breast: Secondary | ICD-10-CM

## 2021-12-30 NOTE — Patient Instructions (Addendum)
Will refer you to Oncology to get the next steps for any treatment needed.   Call with questions or concerns.

## 2021-12-30 NOTE — Progress Notes (Signed)
[facility]  Left breast partial mastectomy site healing. No complaints. No pain. Did not use any pain medication.  BP (!) 162/84   Pulse 63   Temp 98.4 F (36.9 C) (Oral)   Resp 12   Ht '5\' 3"'$  (1.6 m)   Wt 168 lb (76.2 kg)   SpO2 94%   BMI 29.76 kg/m  Incision with minor evolving bruise, dermabond peeling some  Patient s/p L partila mastectomy for DCIS. Margins good.   Will refer you to Oncology to get the next steps for any treatment needed.   Call with questions or concerns.  Curlene Labrum, MD [facility] [address] [place], [place] [address] [phone] (office)

## 2022-01-04 ENCOUNTER — Ambulatory Visit (INDEPENDENT_AMBULATORY_CARE_PROVIDER_SITE_OTHER): Payer: Medicare Other | Admitting: Urology

## 2022-01-04 ENCOUNTER — Encounter: Payer: Self-pay | Admitting: Urology

## 2022-01-04 VITALS — BP 165/85 | HR 69

## 2022-01-04 DIAGNOSIS — N390 Urinary tract infection, site not specified: Secondary | ICD-10-CM

## 2022-01-04 DIAGNOSIS — Z87440 Personal history of urinary (tract) infections: Secondary | ICD-10-CM | POA: Diagnosis not present

## 2022-01-04 MED ORDER — METHENAMINE HIPPURATE 1 G PO TABS: 1.0000 g | ORAL_TABLET | Freq: Two times a day (BID) | ORAL | 11 refills | Status: DC

## 2022-01-04 NOTE — Progress Notes (Signed)
01/04/2022 10:28 AM   Sherry Smith 12/29/1940 [id number]  Referring provider: Glenda Chroman, MD [place],  [place] [address]  Followup recurrent UTI   HPI: Sherry Smith is a 81yo here for followup for recurrent UTI. She is on hiprex daily. No UTIs since last visit. UA today shows leukocytes. She denies nay significant LUTS. No dysuria or hematuria. No other complaints today   PMH: Past Medical History:  Diagnosis Date   Arthritis    Atrial fibrillation ([place])    Chronic headaches 11/29/2016   Dysrhythmia     Surgical History: Past Surgical History:  Procedure Laterality Date   CATARACT EXTRACTION, BILATERAL     Ruptured ovary     left ovary in the 60s    Home Medications:  Allergies as of 01/04/2022       Reactions   Macrodantin [nitrofurantoin] Rash   Sulfa Antibiotics    Headaches.   Tetracyclines & Related    Yeast infection        Medication List        Accurate as of January 04, 2022 10:28 AM. If you have any questions, ask your nurse or doctor.          acetaminophen 325 MG tablet Commonly known as: TYLENOL Take 650 mg by mouth every 6 (six) hours as needed for moderate pain.   CALCIUM + VITAMIN D3 PO Take 1 tablet by mouth daily.   Eliquis 5 MG Tabs tablet Generic drug: apixaban Take 5 mg by mouth 2 (two) times daily.   latanoprost 0.005 % ophthalmic solution Commonly known as: XALATAN Place 1 drop into both eyes at bedtime.   methenamine 1 g tablet Commonly known as: Hiprex Take 1 tablet (1 g total) by mouth 2 (two) times daily with a meal.   metoprolol succinate 25 MG 24 hr tablet Commonly known as: TOPROL-XL Take 12.5 mg by mouth 2 (two) times daily.   ondansetron 4 MG tablet Commonly known as: Zofran Take 1 tablet (4 mg total) by mouth every 8 (eight) hours as needed.   oxyCODONE 5 MG immediate release tablet Commonly known as: Roxicodone Take 1 tablet (5 mg total) by mouth every 4 (four) hours as needed for severe  pain or breakthrough pain.   pantoprazole 40 MG tablet Commonly known as: PROTONIX Take 40 mg by mouth daily.        Allergies:  Allergies  Allergen Reactions   Macrodantin [Nitrofurantoin] Rash   Sulfa Antibiotics     Headaches.   Tetracyclines & Related     Yeast infection    Family History: Family History  Problem Relation Age of Onset   Breast cancer Sister    Breast cancer Paternal Aunt     Social History:  reports that she has never smoked. She has never used smokeless tobacco. She reports that she does not drink alcohol and does not use drugs.  ROS: All other review of systems were reviewed and are negative except what is noted above in HPI  Physical Exam: BP (!) 165/85   Pulse 69   Constitutional:  Alert and oriented, No acute distress. HEENT: [place] AT, moist mucus membranes.  Trachea midline, no masses. Cardiovascular: No clubbing, cyanosis, or edema. Respiratory: Normal respiratory effort, no increased work of breathing. GI: Abdomen is soft, nontender, nondistended, no abdominal masses GU: No CVA tenderness.  Lymph: No cervical or inguinal lymphadenopathy. Skin: No rashes, bruises or suspicious lesions. Neurologic: Grossly intact, no focal deficits, moving  all 4 extremities. Psychiatric: Normal mood and affect.  Laboratory Data: No results found for: "WBC", "HGB", "HCT", "MCV", "PLT"  No results found for: "CREATININE"  No results found for: "PSA"  No results found for: "TESTOSTERONE"  No results found for: "HGBA1C"  Urinalysis    Component Value Date/Time   APPEARANCEUR Cloudy (A) 09/24/2021 1308   GLUCOSEU Negative 09/24/2021 1308   BILIRUBINUR Negative 09/24/2021 1308   PROTEINUR Negative 09/24/2021 1308   NITRITE Negative 09/24/2021 1308   LEUKOCYTESUR 2+ (A) 09/24/2021 1308    Lab Results  Component Value Date   LABMICR See below: 09/24/2021   WBCUA >30 (A) 09/24/2021   LABEPIT 0-10 09/24/2021   MUCUS Present 09/09/2021   BACTERIA  Many (A) 09/24/2021    Pertinent Imaging:  No results found for this or any previous visit.  No results found for this or any previous visit.  No results found for this or any previous visit.  No results found for this or any previous visit.  No results found for this or any previous visit.  No valid procedures specified. No results found for this or any previous visit.  Results for orders placed in visit on 09/09/21  CT RENAL STONE STUDY  Narrative CLINICAL DATA:  Lower abdominal and flank pain for couple months, bladder pain, acute cystitis with hematuria, recurrent UTIs, suspected kidney stone  EXAM: CT ABDOMEN AND PELVIS WITHOUT CONTRAST  TECHNIQUE: Multidetector CT imaging of the abdomen and pelvis was performed following the standard protocol without IV contrast.  RADIATION DOSE REDUCTION: This exam was performed according to the departmental dose-optimization program which includes automated exposure control, adjustment of the mA and/or kV according to patient size and/or use of iterative reconstruction technique.  COMPARISON:  None  FINDINGS: Lower chest: Elevation of LEFT diaphragm with LEFT basilar atelectasis  Hepatobiliary: Large calcified gallstone in gallbladder 2.1 cm diameter. Gallbladder and liver otherwise normal appearance  Pancreas: Atrophic pancreas without mass  Spleen: Normal appearance  Adrenals/Urinary Tract: Adrenal glands normal appearance. Tiny peripelvic cysts LEFT kidney; no follow-up imaging recommended. Kidneys, ureters, and bladder otherwise normal appearance.  Stomach/Bowel: Appendix not visualized. No pericecal inflammatory process seen. Stomach decompressed. Large and small bowel loops unremarkable.  Vascular/Lymphatic: Atherosclerotic calcifications aorta and iliac arteries without aneurysm. No adenopathy.  Reproductive: Atrophic uterus and ovaries  Other: No free air or free fluid. LEFT inguinal hernia  containing fat. Small umbilical hernia containing fat. No definite inflammatory process.  Musculoskeletal: Osseous demineralization. Rotatory scoliosis of the thoracolumbar spine. Scattered degenerative disc and facet disease changes thoracolumbar spine.  IMPRESSION: Cholelithiasis without evidence of acute cholecystitis.  LEFT inguinal and umbilical hernias containing fat.  No acute intra-abdominal or intrapelvic abnormalities.  Aortic Atherosclerosis (ICD10-I70.0).   Electronically Signed By: Lavonia Dana M.D. On: 09/15/2021 17:05   Assessment & Plan:    1. Recurrent UTI -Continue hiprex daily -Followup 6 months  - Urinalysis, Routine w reflex microscopic - Urine Culture   No follow-ups on file.  Nicolette Bang, MD  [facility] [place]

## 2022-01-04 NOTE — Patient Instructions (Signed)
Urinary Tract Infection, Adult  A urinary tract infection (UTI) is an infection of any part of the urinary tract. The urinary tract includes the kidneys, ureters, bladder, and urethra. These organs make, store, and get rid of urine in the body. An upper UTI affects the ureters and kidneys. A lower UTI affects the bladder and urethra. What are the causes? Most urinary tract infections are caused by bacteria in your genital area around your urethra, where urine leaves your body. These bacteria grow and cause inflammation of your urinary tract. What increases the risk? You are more likely to develop this condition if: You have a urinary catheter that stays in place. You are not able to control when you urinate or have a bowel movement (incontinence). You are female and you: Use a spermicide or diaphragm for birth control. Have low estrogen levels. Are pregnant. You have certain genes that increase your risk. You are sexually active. You take antibiotic medicines. You have a condition that causes your flow of urine to slow down, such as: An enlarged prostate, if you are female. Blockage in your urethra. A kidney stone. A nerve condition that affects your bladder control (neurogenic bladder). Not getting enough to drink, or not urinating often. You have certain medical conditions, such as: Diabetes. A weak disease-fighting system (immunesystem). Sickle cell disease. Gout. Spinal cord injury. What are the signs or symptoms? Symptoms of this condition include: Needing to urinate right away (urgency). Frequent urination. This may include small amounts of urine each time you urinate. Pain or burning with urination. Blood in the urine. Urine that smells bad or unusual. Trouble urinating. Cloudy urine. Vaginal discharge, if you are female. Pain in the abdomen or the lower back. You may also have: Vomiting or a decreased appetite. Confusion. Irritability or tiredness. A fever or  chills. Diarrhea. The first symptom in older adults may be confusion. In some cases, they may not have any symptoms until the infection has worsened. How is this diagnosed? This condition is diagnosed based on your medical history and a physical exam. You may also have other tests, including: Urine tests. Blood tests. Tests for STIs (sexually transmitted infections). If you have had more than one UTI, a cystoscopy or imaging studies may be done to determine the cause of the infections. How is this treated? Treatment for this condition includes: Antibiotic medicine. Over-the-counter medicines to treat discomfort. Drinking enough water to stay hydrated. If you have frequent infections or have other conditions such as a kidney stone, you may need to see a health care provider who specializes in the urinary tract (urologist). In rare cases, urinary tract infections can cause sepsis. Sepsis is a life-threatening condition that occurs when the body responds to an infection. Sepsis is treated in the hospital with IV antibiotics, fluids, and other medicines. Follow these instructions at home:  Medicines Take over-the-counter and prescription medicines only as told by your health care provider. If you were prescribed an antibiotic medicine, take it as told by your health care provider. Do not stop using the antibiotic even if you start to feel better. General instructions Make sure you: Empty your bladder often and completely. Do not hold urine for long periods of time. Empty your bladder after sex. Wipe from front to back after urinating or having a bowel movement if you are female. Use each tissue only one time when you wipe. Drink enough fluid to keep your urine pale yellow. Keep all follow-up visits. This is important. Contact a health   care provider if: Your symptoms do not get better after 1-2 days. Your symptoms go away and then return. Get help right away if: You have severe pain in  your back or your lower abdomen. You have a fever or chills. You have nausea or vomiting. Summary A urinary tract infection (UTI) is an infection of any part of the urinary tract, which includes the kidneys, ureters, bladder, and urethra. Most urinary tract infections are caused by bacteria in your genital area. Treatment for this condition often includes antibiotic medicines. If you were prescribed an antibiotic medicine, take it as told by your health care provider. Do not stop using the antibiotic even if you start to feel better. Keep all follow-up visits. This is important. This information is not intended to replace advice given to you by your health care provider. Make sure you discuss any questions you have with your health care provider. Document Revised: 09/13/2019 Document Reviewed: 09/13/2019 Elsevier Patient Education  2023 Elsevier Inc.  

## 2022-01-05 LAB — URINALYSIS, ROUTINE W REFLEX MICROSCOPIC
Bilirubin, UA: NEGATIVE
Glucose, UA: NEGATIVE
Ketones, UA: NEGATIVE
Nitrite, UA: POSITIVE — AB
Protein,UA: NEGATIVE
Specific Gravity, UA: 1.01 (ref 1.005–1.030)
Urobilinogen, Ur: 0.2 mg/dL (ref 0.2–1.0)
pH, UA: 5.5 (ref 5.0–7.5)

## 2022-01-05 LAB — MICROSCOPIC EXAMINATION: WBC, UA: >30 /hpf — AB (ref 0–5)

## 2022-01-08 LAB — URINE CULTURE

## 2022-01-10 ENCOUNTER — Telehealth: Payer: Self-pay

## 2022-01-10 MED ORDER — AMOXICILLIN-POT CLAVULANATE 875-125 MG PO TABS: 1.0000 | ORAL_TABLET | Freq: Two times a day (BID) | ORAL | 0 refills | Status: DC

## 2022-01-10 NOTE — Telephone Encounter (Signed)
Augmentin sent in to pharmacy per Dr. Alyson Ingles for positive urine culture. Patient called and made aware.

## 2022-01-18 ENCOUNTER — Inpatient Hospital Stay: Payer: Medicare Other | Attending: Hematology | Admitting: Hematology

## 2022-01-18 VITALS — BP 161/74 | HR 64 | Temp 97.3°F | Resp 18 | Ht 62.0 in | Wt 168.7 lb

## 2022-01-18 DIAGNOSIS — Z803 Family history of malignant neoplasm of breast: Secondary | ICD-10-CM | POA: Diagnosis not present

## 2022-01-18 DIAGNOSIS — D0512 Intraductal carcinoma in situ of left breast: Secondary | ICD-10-CM | POA: Diagnosis not present

## 2022-01-18 DIAGNOSIS — I4891 Unspecified atrial fibrillation: Secondary | ICD-10-CM | POA: Insufficient documentation

## 2022-01-18 DIAGNOSIS — E559 Vitamin D deficiency, unspecified: Secondary | ICD-10-CM

## 2022-01-18 DIAGNOSIS — I4892 Unspecified atrial flutter: Secondary | ICD-10-CM

## 2022-01-18 DIAGNOSIS — Z7901 Long term (current) use of anticoagulants: Secondary | ICD-10-CM | POA: Diagnosis not present

## 2022-01-18 DIAGNOSIS — Z79899 Other long term (current) drug therapy: Secondary | ICD-10-CM | POA: Diagnosis not present

## 2022-01-18 DIAGNOSIS — Z09 Encounter for follow-up examination after completed treatment for conditions other than malignant neoplasm: Secondary | ICD-10-CM

## 2022-01-18 MED ORDER — ANASTROZOLE 1 MG PO TABS: 1.0000 mg | ORAL_TABLET | Freq: Every day | ORAL | 6 refills | Status: DC

## 2022-01-18 NOTE — Progress Notes (Signed)
[facility] NOTE  Patient Care Team: Glenda Chroman, MD as PCP - General (Internal Medicine) Derek Jack, MD as Medical Oncologist (Medical Oncology) Brien Mates, RN as Oncology Nurse Navigator (Medical Oncology)  CHIEF COMPLAINTS/PURPOSE OF CONSULTATION:  High-grade DCIS of the left breast  HISTORY OF PRESENTING ILLNESS:  Sherry Smith 81 y.o. female is seen in consultation today at the request of Dr. Constance Haw for newly diagnosed left breast DCIS.  Diagnostic mammogram on 10/27/2021 showed indeterminate 0.7 cm group of calcifications in the upper central left breast.  She underwent left breast biopsy on 11/10/2021 which showed high-grade DCIS, ER/PR positive.  She underwent left lumpectomy on 12/17/2021.  She has arthritis of the back and small joints of the hands.  She is accompanied by her daughter today.  She lives by herself and is independent of ADLs and IADLs. She had previous breast biopsy which was benign several years ago.  She attained menarche at age 52.  She attained menopause in her late 52s.  She does not remember whether she was on hormone replacement therapy.  She has 2 children and her age at first childbirth was 21.  She took oral contraceptive pills for 6 months.  MEDICAL HISTORY:  Past Medical History:  Diagnosis Date   Arthritis    Atrial fibrillation [facility])    Chronic headaches 11/29/2016   Dysrhythmia     SURGICAL HISTORY: Past Surgical History:  Procedure Laterality Date   CATARACT EXTRACTION, BILATERAL     Ruptured ovary     left ovary in the 60s    SOCIAL HISTORY: Social History   Socioeconomic History   Marital status: Widowed    Spouse name: Not on file   Number of children: Not on file   Years of education: Not on file   Highest education level: Not on file  Occupational History   Not on file  Tobacco Use   Smoking status: Never   Smokeless tobacco: Never  Substance and Sexual Activity   Alcohol use: No    Drug use: No   Sexual activity: Not on file  Other Topics Concern   Not on file  Social History Narrative   Not on file   Social Determinants of Health   Financial Resource Strain: Not on file  Food Insecurity: No Food Insecurity (01/18/2022)   Hunger Vital Sign    Worried About Running Out of Food in the Last Year: Never true    Ran Out of Food in the Last Year: Never true  Transportation Needs: No Transportation Needs (01/18/2022)   PRAPARE - Hydrologist (Medical): No    Lack of Transportation (Non-Medical): No  Physical Activity: Not on file  Stress: Not on file  Social Connections: Not on file  Intimate Partner Violence: Not At Risk (01/18/2022)   Humiliation, Afraid, Rape, and Kick questionnaire    Fear of Current or Ex-Partner: No    Emotionally Abused: No    Physically Abused: No    Sexually Abused: No    FAMILY HISTORY: Family History  Problem Relation Age of Onset   Breast cancer Sister    Breast cancer Paternal Aunt     ALLERGIES:  is allergic to macrodantin [nitrofurantoin], sulfa antibiotics, and tetracyclines & related.  MEDICATIONS:  Current Outpatient Medications  Medication Sig Dispense Refill   acetaminophen (TYLENOL) 325 MG tablet Take 650 mg by mouth every 6 (six) hours as needed for moderate pain.  amoxicillin-clavulanate (AUGMENTIN) 875-125 MG tablet Take 1 tablet by mouth 2 (two) times daily. 14 tablet 0   anastrozole (ARIMIDEX) 1 MG tablet Take 1 tablet (1 mg total) by mouth daily. 30 tablet 6   Calcium Carb-Cholecalciferol (CALCIUM + VITAMIN D3 PO) Take 1 tablet by mouth daily.     ELIQUIS 5 MG TABS tablet Take 5 mg by mouth 2 (two) times daily.     latanoprost (XALATAN) 0.005 % ophthalmic solution Place 1 drop into both eyes at bedtime.     methenamine (HIPREX) 1 g tablet Take 1 tablet (1 g total) by mouth 2 (two) times daily with a meal. 60 tablet 11   metoprolol succinate (TOPROL-XL) 25 MG 24 hr tablet Take 12.5  mg by mouth 2 (two) times daily.     pantoprazole (PROTONIX) 40 MG tablet Take 40 mg by mouth daily.     No current facility-administered medications for this visit.    REVIEW OF SYSTEMS:   Constitutional: Denies fevers, chills or abnormal night sweats Eyes: Denies blurriness of vision, double vision or watery eyes Ears, nose, mouth, throat, and face: Denies mucositis or sore throat Respiratory: Positive for dyspnea on exertion. Cardiovascular: Positive for palpitations from A-fib. Gastrointestinal:  Denies nausea, heartburn or change in bowel habits Skin: Denies abnormal skin rashes Lymphatics: Denies new lymphadenopathy or easy bruising Neurological:Denies numbness, tingling or new weaknesses Behavioral/Psych: Mood is stable, no new changes  All other systems were reviewed with the patient and are negative.  PHYSICAL EXAMINATION: ECOG PERFORMANCE STATUS: 1 - Symptomatic but completely ambulatory  Vitals:   01/18/22 1337  BP: (!) 161/74  Pulse: 64  Resp: 18  Temp: (!) 97.3 F (36.3 C)  SpO2: 100%   Filed Weights   01/18/22 1337  Weight: 168 lb 11.2 oz (76.5 kg)    GENERAL:alert, no distress and comfortable SKIN: skin color, texture, turgor are normal, no rashes or significant lesions EYES: normal, conjunctiva are pink and non-injected, sclera clear OROPHARYNX:no exudate, no erythema and lips, buccal mucosa, and tongue normal  NECK: supple, thyroid normal size, non-tender, without nodularity LYMPH:  no palpable lymphadenopathy in the cervical, axillary or inguinal LUNGS: clear to auscultation and percussion with normal breathing effort HEART: Irregular rate and rhythm.  1+ edema bilaterally. ABDOMEN:abdomen soft, non-tender and normal bowel sounds Musculoskeletal:no cyanosis of digits and no clubbing  PSYCH: alert & oriented x 3 with fluent speech NEURO: no focal motor/sensory deficits  LABORATORY DATA:  I have reviewed the data as listed No results found for:  "WBC", "HGB", "HCT", "MCV", "PLT"   Chemistry   No results found for: "NA", "K", "CL", "CO2", "BUN", "CREATININE", "GLU" No results found for: "CALCIUM", "ALKPHOS", "AST", "ALT", "BILITOT"     RADIOGRAPHIC STUDIES: I have personally reviewed the radiological images as listed and agreed with the findings in the report. No results found.  ASSESSMENT:  1.  High-grade DCIS of the left breast: - Bilateral diagnostic mammogram (10/27/2021): 0.7 cm group of calcifications in the upper central left breast. - Left breast core biopsy 12:00 (11/10/2021): High-grade DCIS, solid type with necrosis.  Negative for invasive carcinoma.  ER 40%, PR 5% strong staining. - Left breast lumpectomy (12/17/2021): Grade 3 DCIS with central comedonecrosis and microcalcifications, 1.4 cm.  Margins negative.  Distance from DCIS to closest margin 4 mm.  pTis.  ER 40%, PR 5%  2.  Social/family history: - She lives at home by herself.  She is independent of ADLs and IADLs.  She is accompanied  by her daughter today.  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  She is a never smoker. - Sister had breast cancer in her late 62s.  Paternal aunt died of breast cancer.   PLAN:  1.  High-grade DCIS of the left breast: - We have reviewed pathology report in detail. - Recommend antiestrogen therapy with anastrozole for 5 years.  We discussed side effects in detail. - Recommend radiation oncology consultation. - Given personal and family history, recommend geneticist evaluation. - RTC 3 months for follow-up to see how she is tolerating anastrozole.  2.  Bone health: - Will check vitamin D level at next visit.  Will also check baseline bone density test prior to next visit.  Orders Placed This Encounter  Procedures   DG Bone Density    Standing Status:   Future    Standing Expiration Date:   01/18/2023    Order Specific Question:   Reason for Exam (SYMPTOM  OR DIAGNOSIS  REQUIRED)    Answer:   AI therapy for DCIS    Order Specific Question:   Preferred imaging location?    Answer:   [facility]    Order Specific Question:   Release to patient    Answer:   Immediate   CBC with Differential    Standing Status:   Future    Standing Expiration Date:   01/18/2023   Comprehensive metabolic panel    Standing Status:   Future    Standing Expiration Date:   01/18/2023   Vitamin D 25 hydroxy    Standing Status:   Future    Standing Expiration Date:   01/18/2023    All questions were answered. The patient knows to call the clinic with any problems, questions or concerns.      Derek Jack, MD 01/18/2022 4:33 PM

## 2022-01-18 NOTE — Patient Instructions (Addendum)
[facility]  Discharge Instructions  You were seen and examined today by Dr. Delton Coombes. Dr. Delton Coombes is a medical oncologist, meaning that he specializes in the treatment of cancer diagnoses. Dr. Delton Coombes discussed your past medical history, family history of cancers, and the events that led to you being here today.  You were referred to Dr. 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 This is considered a pre-breast cancer. It does place you at a higher risk for development of breast cancer, so the goal is to prevent that from developing.  Dr. Delton Coombes has recommended taking an anti-estrogen pill for at least 5 years. This pill is known as Anastrozole. It is taken once daily, at the same time each day. The most common side effects are hot flashes and body/muscle aches. These symptoms typically improve with continued treatment.  You will also need a bone density scan as a baseline. With continued therapy, Anastrozole can weaken the bones, so we will monitor this accordingly.  We will also refer you to [facility] for further evaluation of radiation therapy.  Follow-up as scheduled.  Thank you for choosing [facility] to provide your oncology and hematology care.   To afford each patient quality time with our provider, please arrive at least 15 minutes before your scheduled appointment time. You may need to reschedule your appointment if you arrive late (10 or more minutes). Arriving late affects you and other patients whose appointments are after yours.  Also, if you miss three or more appointments without notifying the office, you may be dismissed from the clinic at the provider's discretion.    Again, thank you for choosing [facility].  Our hope is that these requests will decrease the amount of time that you wait before being seen by our  physicians.   If you have a lab appointment with the [facility] please come in thru the Main Entrance and check in at the main information desk.           _____________________________________________________________  Should you have questions after your visit to [facility], please contact our office at [phone] and follow the prompts.  Our office hours are 8:00 a.m. to 4:30 p.m. Monday - Thursday and 8:00 a.m. to 2:30 p.m. Friday.  Please note that voicemails left after 4:00 p.m. may not be returned until the following business day.  We are closed weekends and all major holidays.  You do have access to a nurse 24-7, just call the main number to the clinic [phone] and do not press any options, hold on the line and a nurse will answer the phone.    For prescription refill requests, have your pharmacy contact our office and allow 72 hours.    Masks are optional in the cancer centers. If you would like for your care team to wear a mask while they are taking care of you, please let them know. You may have one support person who is at least 82 years old accompany you for your appointments.

## 2022-01-21 DIAGNOSIS — D0592 Unspecified type of carcinoma in situ of left breast: Secondary | ICD-10-CM | POA: Diagnosis not present

## 2022-01-24 DIAGNOSIS — I10 Essential (primary) hypertension: Secondary | ICD-10-CM | POA: Diagnosis not present

## 2022-01-24 DIAGNOSIS — I480 Paroxysmal atrial fibrillation: Secondary | ICD-10-CM | POA: Diagnosis not present

## 2022-01-24 DIAGNOSIS — Z23 Encounter for immunization: Secondary | ICD-10-CM | POA: Diagnosis not present

## 2022-02-24 ENCOUNTER — Inpatient Hospital Stay: Payer: Medicare Other

## 2022-02-24 ENCOUNTER — Encounter: Payer: Self-pay | Admitting: Licensed Clinical Social Worker

## 2022-02-24 ENCOUNTER — Inpatient Hospital Stay: Payer: Medicare Other | Attending: Hematology | Admitting: Licensed Clinical Social Worker

## 2022-02-24 ENCOUNTER — Other Ambulatory Visit: Payer: Self-pay

## 2022-02-24 DIAGNOSIS — Z800 Family history of malignant neoplasm of digestive organs: Secondary | ICD-10-CM

## 2022-02-24 DIAGNOSIS — Z803 Family history of malignant neoplasm of breast: Secondary | ICD-10-CM | POA: Diagnosis not present

## 2022-02-24 DIAGNOSIS — D0512 Intraductal carcinoma in situ of left breast: Secondary | ICD-10-CM

## 2022-02-24 LAB — GENETIC SCREENING ORDER

## 2022-02-24 NOTE — Progress Notes (Signed)
REFERRING PROVIDER: Derek Jack, MD [address] [place],  [place] [address]  PRIMARY PROVIDER:  Glenda Chroman, MD  PRIMARY REASON FOR VISIT:  1. Ductal carcinoma in situ (DCIS) of left breast   2. Family history of breast cancer      HISTORY OF PRESENT ILLNESS:   Ms. Boutelle, a 82 y.o. female, was seen for a [facility] cancer genetics consultation at the request of Dr. Delton Coombes due to a personal and family history of breast cancer.  Ms. Deignan presents to clinic today to discuss the possibility of a hereditary predisposition to cancer, genetic testing, and to further clarify her future cancer risks, as well as potential cancer risks for family members.   CANCER HISTORY:  In 2023, at the age of 89, Ms. Mckenna was diagnosed with DCIS of the left breast. The treatment plan included lumpectomy, completed 12/2021, and antiestrogen therapy.  RISK FACTORS:  Menarche was at age 24.  First live birth at age 34.  OCP use for approximately  6 mos Ovaries intact: has 1 ovary.  Hysterectomy: no.  Menopausal status: postmenopausal.  Colonoscopy: yes; normal. Mammogram within the last year: yes. Number of breast biopsies: 2. Any excessive radiation exposure in the past: no  Past Medical History:  Diagnosis Date   Arthritis    Atrial fibrillation [facility])    Chronic headaches 11/29/2016   Dysrhythmia     Past Surgical History:  Procedure Laterality Date   CATARACT EXTRACTION, BILATERAL     Ruptured ovary     left ovary in the 60s    FAMILY HISTORY:  We obtained a detailed, 4-generation family history.  Significant diagnoses are listed below: Family History  Problem Relation Age of Onset   Breast cancer Sister 84 - 91   Colon cancer Maternal Aunt 69 - 52   Brain cancer Maternal Uncle 70 - 80   Breast cancer Paternal Aunt 79 - 79       recurrence, d. 5s   Ms.Breisch has 1 son, 77, and 1 daughter, 43. She has many grandchildren. She has 1 sister, 23, who had breast cancer in  her 73s.   Ms. Casler mother died at 24. Patient had 2 maternal aunts, 2 maternal uncles. One aunt had colon cancer in her 42s-60s and passed later in life of dementia. One uncle had brain cancer and died in his 66s. No other cancers on this side of the family.  Ms. Winkles father died at 35. Patient had 1 paternal aunt, she had breast cancer in her 36s and passed of a recurrence in her 68s. No other known cancers on this side of the family.  Ms. Barasch is unaware of previous family history of genetic testing for hereditary cancer risks. There is no reported Ashkenazi Jewish ancestry. There is no known consanguinity.    GENETIC COUNSELING ASSESSMENT: Ms. Tamura is a 82 y.o. female with a personal and family history of breast cancer which is somewhat suggestive of a hereditary cancer syndrome and predisposition to cancer. We, therefore, discussed and recommended the following at today's visit.   DISCUSSION: We discussed that approximately 10% of breast cancer is hereditary. Most cases of hereditary breast cancer are associated with BRCA1/BRCA2 genes, although there are other genes associated with hereditary cancer as well. Cancers and risks are gene specific. We discussed that testing is beneficial for several reasons including knowing about cancer risks, identifying potential screening and risk-reduction options that may be appropriate, and to understand if other family members  could be at risk for cancer and allow them to undergo genetic testing.   We reviewed the characteristics, features and inheritance patterns of hereditary cancer syndromes. We also discussed genetic testing, including the appropriate family members to test, the process of testing, insurance coverage and turn-around-time for results. We discussed the implications of a negative, positive and/or variant of uncertain significant result. We recommended Ms. Hale pursue genetic testing for the [facility] Common Hereditary Cancers+RNA  gene panel.   Based on Ms. Lamadrid's personal and family history of cancer, she meets medical criteria for genetic testing. Despite that she meets criteria, she may still have an out of pocket cost. We discussed that if her out of pocket cost for testing is over $100, the laboratory will call and confirm whether she wants to proceed with testing.  If the out of pocket cost of testing is less than $100 she will be billed by the genetic testing laboratory.   PLAN: After considering the risks, benefits, and limitations, Ms. Matranga provided informed consent to pursue genetic testing and the blood sample was sent to [facility] for analysis of the Common Hereditary Cancers+RNA panel. Results should be available within approximately 2-3 weeks' time, at which point they will be disclosed by telephone to Ms. Mckinlay, as will any additional recommendations warranted by these results. Ms. Frees will receive a summary of her genetic counseling visit and a copy of her results once available. This information will also be available in Epic.   Ms. Becka questions were answered to her satisfaction today. Our contact information was provided should additional questions or concerns arise. Thank you for the referral and allowing [place] to share in the care of your patient.   Faith Rogue, MS, [facility] Genetic Counselor [email or web address] Phone: [phone]  The patient was seen for a total of 25 minutes in virtual genetic counseling. Her daughter Margarita Grizzle was also present.  Dr. Grayland Ormond was available for discussion regarding this case.   _______________________________________________________________________ For Office Staff:  Number of people involved in session: 2 Was an Intern/ student involved with case: no

## 2022-03-07 ENCOUNTER — Telehealth: Payer: Self-pay | Admitting: Licensed Clinical Social Worker

## 2022-03-07 ENCOUNTER — Encounter: Payer: Self-pay | Admitting: Licensed Clinical Social Worker

## 2022-03-07 ENCOUNTER — Ambulatory Visit: Payer: Self-pay | Admitting: Licensed Clinical Social Worker

## 2022-03-07 DIAGNOSIS — Z1379 Encounter for other screening for genetic and chromosomal anomalies: Secondary | ICD-10-CM | POA: Insufficient documentation

## 2022-03-07 NOTE — Telephone Encounter (Signed)
I contacted Ms. Sasso to discuss her genetic testing results. No pathogenic variants were identified in the 48 genes analyzed. Detailed clinic note to follow.   The test report has been scanned into EPIC and is located under the Molecular Pathology section of the Results Review tab.  A portion of the result report is included below for reference.      Faith Rogue, MS, [facility] Genetic Counselor [email or web address] Phone: [phone]

## 2022-03-07 NOTE — Progress Notes (Signed)
HPI:   Sherry Smith was previously seen in the [place] clinic due to a personal and family history of breast cancer and concerns regarding a hereditary predisposition to cancer. Please refer to our prior cancer genetics clinic note for more information regarding our discussion, assessment and recommendations, at the time. Sherry Smith recent genetic test results were disclosed to her, as were recommendations warranted by these results. These results and recommendations are discussed in more detail below.  CANCER HISTORY:  Oncology History  Ductal carcinoma in situ (DCIS) of left breast  11/24/2021 Initial Diagnosis   Ductal carcinoma in situ (DCIS) of left breast    Genetic Testing   Negative genetic testing. No pathogenic variants identified on the [facility] Common Hereditary Cancers+RNA panel. The report date is 03/06/2022.  The Common Hereditary Cancers Panel + RNA offered by [facility] includes sequencing and/or deletion duplication testing of the following 48 genes: APC*, ATM*, AXIN2, BAP1, BARD1, BMPR1A, BRCA1, BRCA2, BRIP1, CDH1, CDK4, CDKN2A (p14ARF), CDKN2A (p16INK4a), CHEK2, CTNNA1, DICER1*, EPCAM*, FH*, GREM1*, HOXB13, KIT, MBD4, MEN1*, MLH1*, MSH2*, MSH3*, MSH6*, MUTYH, NF1*, NTHL1, PALB2, PDGFRA, PMS2*, POLD1*, POLE, PTEN*, RAD51C, RAD51D, SDHA*, SDHB, SDHC*, SDHD, SMAD4, SMARCA4, STK11, TP53, TSC1*, TSC2, VHL.      FAMILY HISTORY:  We obtained a detailed, 4-generation family history.  Significant diagnoses are listed below: Family History  Problem Relation Age of Onset   Breast cancer Sister 47 - 45   Colon cancer Maternal Aunt 43 - 50   Brain cancer Maternal Uncle 70 - 80   Breast cancer Paternal Aunt 27 - 59       recurrence, d. 68s   Sherry Smith has 1 son, 31, and 1 daughter, 49. She has many grandchildren. She has 1 sister, 29, who had breast cancer in her 83s.    Sherry Smith mother died at 72. Patient had 2 maternal aunts, 2 maternal uncles. One aunt had  colon cancer in her 74s-60s and passed later in life of dementia. One uncle had brain cancer and died in his 18s. No other cancers on this side of the family.   Sherry Smith father died at 25. Patient had 1 paternal aunt, she had breast cancer in her 82s and passed of a recurrence in her 22s. No other known cancers on this side of the family.   Sherry Smith is unaware of previous family history of genetic testing for hereditary cancer risks. There is no reported Ashkenazi Jewish ancestry. There is no known consanguinity.      GENETIC TEST RESULTS:  The [facility] Common Hereditary Cancers+RNA Panel found no pathogenic mutations.   The Common Hereditary Cancers Panel + RNA offered by [facility] includes sequencing and/or deletion duplication testing of the following 48 genes: APC*, ATM*, AXIN2, BAP1, BARD1, BMPR1A, BRCA1, BRCA2, BRIP1, CDH1, CDK4, CDKN2A (p14ARF), CDKN2A (p16INK4a), CHEK2, CTNNA1, DICER1*, EPCAM*, FH*, GREM1*, HOXB13, KIT, MBD4, MEN1*, MLH1*, MSH2*, MSH3*, MSH6*, MUTYH, NF1*, NTHL1, PALB2, PDGFRA, PMS2*, POLD1*, POLE, PTEN*, RAD51C, RAD51D, SDHA*, SDHB, SDHC*, SDHD, SMAD4, SMARCA4, STK11, TP53, TSC1*, TSC2, VHL.Marland Kitchen   The test report has been scanned into EPIC and is located under the Molecular Pathology section of the Results Review tab.  A portion of the result report is included below for reference. Genetic testing reported out on 03/06/2022.     Even though a pathogenic variant was not identified, possible explanations for the cancer in the family may include: There may be no hereditary risk for cancer in the family. The cancers in Sherry Smith  and/or her family may be sporadic/familial or due to other genetic and environmental factors. There may be a gene mutation in one of these genes that current testing methods cannot detect but that chance is small. There could be another gene that has not yet been discovered, or that we have not yet tested, that is responsible for the cancer  diagnoses in the family.  It is also possible there is a hereditary cause for the cancer in the family that Sherry Smith did not inherit  Therefore, it is important to remain in touch with cancer genetics in the future so that we can continue to offer Sherry Smith the most up to date genetic testing.   ADDITIONAL GENETIC TESTING:  We discussed with Sherry Smith that her genetic testing was fairly extensive.  If there are additional relevant genes identified to increase cancer risk that can be analyzed in the future, we would be happy to discuss and coordinate this testing at that time.    CANCER SCREENING RECOMMENDATIONS:  Sherry Smith test result is considered negative (normal).  This means that we have not identified a hereditary cause for her personal and family history of cancer at this time.   An individual's cancer risk and medical management are not determined by genetic test results alone. Overall cancer risk assessment incorporates additional factors, including personal medical history, family history, and any available genetic information that may result in a personalized plan for cancer prevention and surveillance. Therefore, it is recommended she continue to follow the cancer management and screening guidelines provided by her oncology and primary healthcare provider.  RECOMMENDATIONS FOR FAMILY MEMBERS:   Since she did not inherit a identifiable mutation in a cancer predisposition gene included on this panel, her children could not have inherited a known mutation from her in one of these genes. Individuals in this family might be at some increased risk of developing cancer, over the general population risk, due to the family history of cancer.  Individuals in the family should notify their providers of the family history of cancer. We recommend women in this family have a yearly mammogram beginning at age 45, or 14 years younger than the earliest onset of cancer, an annual clinical breast exam,  and perform monthly breast self-exams.  Family members should have colonoscopies by at age 40, or earlier, as recommended by their providers. Other members of the family may still carry a pathogenic variant in one of these genes that Sherry Smith did not inherit. Based on the family history, we recommend those related to her aunt who had breast cancer in her 74s have genetic counseling and testing. Sherry Smith will let us know if we can be of any assistance in coordinating genetic counseling and/or testing for this family member.    FOLLOW-UP:  Lastly, we discussed with Sherry Smith. Fries that cancer genetics is a rapidly advancing field and it is possible that new genetic tests will be appropriate for her and/or her family members in the future. We encouraged her to remain in contact with cancer genetics on an annual basis so we can update her personal and family histories and let her know of advances in cancer genetics that may benefit this family.   Our contact number was provided. Sherry Smith. Scarpati questions were answered to her satisfaction, and she knows she is welcome to call us at anytime with additional questions or concerns.    Faith Rogue, Sherry Smith, [facility] Genetic Counselor [email or web address] Phone: [phone]

## 2022-03-22 ENCOUNTER — Telehealth: Payer: Self-pay

## 2022-03-22 ENCOUNTER — Ambulatory Visit (INDEPENDENT_AMBULATORY_CARE_PROVIDER_SITE_OTHER): Payer: Medicare Other | Admitting: Urology

## 2022-03-22 DIAGNOSIS — R300 Dysuria: Secondary | ICD-10-CM

## 2022-03-22 DIAGNOSIS — R350 Frequency of micturition: Secondary | ICD-10-CM | POA: Diagnosis not present

## 2022-03-22 DIAGNOSIS — N390 Urinary tract infection, site not specified: Secondary | ICD-10-CM | POA: Diagnosis not present

## 2022-03-22 LAB — URINALYSIS, ROUTINE W REFLEX MICROSCOPIC
Bilirubin, UA: NEGATIVE
Glucose, UA: NEGATIVE
Ketones, UA: NEGATIVE
Nitrite, UA: NEGATIVE
Protein,UA: NEGATIVE
Specific Gravity, UA: <1.005 — ABNORMAL LOW (ref 1.005–1.030)
Urobilinogen, Ur: 0.2 mg/dL (ref 0.2–1.0)
pH, UA: 5.5 (ref 5.0–7.5)

## 2022-03-22 LAB — MICROSCOPIC EXAMINATION: WBC, UA: >30 /hpf — AB (ref 0–5)

## 2022-03-22 MED ORDER — CEFPODOXIME PROXETIL 200 MG PO TABS: 200.0000 mg | ORAL_TABLET | Freq: Two times a day (BID) | ORAL | 0 refills | Status: DC

## 2022-03-22 NOTE — Telephone Encounter (Signed)
Patient called advising she has a hx of UTI's and is currently having dysuria along with frequent urination and would like to come this afternoon to drop off a urine sample.

## 2022-03-22 NOTE — Telephone Encounter (Signed)
Pt added to [place] schedule will call with results.

## 2022-03-22 NOTE — Progress Notes (Signed)
Ua results reviewed with Dr. Alyson Ingles. Patient started on Vantin bid x 7 days.  Patient called and made aware.  Urine sent for culture.

## 2022-03-22 NOTE — Telephone Encounter (Signed)
Ua results reviewed with Dr. Alyson Ingles. Vantin bid x 7 days sent to pharmacy. Patient called and made aware.

## 2022-03-25 LAB — URINE CULTURE

## 2022-03-28 ENCOUNTER — Telehealth: Payer: Self-pay

## 2022-03-28 NOTE — Telephone Encounter (Signed)
Patient call in today and voiced that she was checking on the results of her urine culture. Made patient aware that the urine culture resulted and I will send Dr. Diona Fanti a task on the culture. Once the MD response someone will reach back out to her with results and treatment plan. Patient voiced that Sherry Smith is not helping her sxs and her urine is very cloudy.

## 2022-03-29 ENCOUNTER — Other Ambulatory Visit: Payer: Self-pay

## 2022-03-29 ENCOUNTER — Other Ambulatory Visit: Payer: Self-pay | Admitting: Urology

## 2022-03-29 DIAGNOSIS — N390 Urinary tract infection, site not specified: Secondary | ICD-10-CM

## 2022-03-29 MED ORDER — FOSFOMYCIN TROMETHAMINE 3 G PO PACK: 3.0000 g | PACK | Freq: Once | ORAL | 1 refills | Status: DC

## 2022-03-29 MED ORDER — FOSFOMYCIN TROMETHAMINE 3 G PO PACK: 3.0000 g | PACK | Freq: Once | ORAL | 0 refills | Status: DC

## 2022-03-29 MED ORDER — FOSFOMYCIN TROMETHAMINE 3 G PO PACK: 3.0000 g | PACK | ORAL | Status: DC

## 2022-03-29 NOTE — Progress Notes (Signed)
Fosfomycin rx ordered as clinically given med.  Reordered rx and sent to pharmacy.  Patient aware.

## 2022-03-29 NOTE — Telephone Encounter (Addendum)
Patient called in today and voiced that the antix she was taking did not help with her sxs, patient voiced that she has been breaking out in sweats and she is not feeling well. Patient voiced that she is still having sxs of pain when voiding. Verbal from Dr. Diona Fanti to stop taking Vantin and a nex antix Monurol will be sent to her pharmacy that should cover her infection. Patient voiced understanding

## 2022-03-30 MED ORDER — FOSFOMYCIN TROMETHAMINE 3 G PO PACK: 3.0000 g | PACK | Freq: Once | ORAL | 1 refills | Status: AC

## 2022-03-30 NOTE — Addendum Note (Signed)
Addended by: Darcella Gasman R on: 03/30/2022 08:43 AM   Modules accepted: Orders

## 2022-03-30 NOTE — Progress Notes (Signed)
Rx reorder and PA started.

## 2022-03-31 ENCOUNTER — Telehealth: Payer: Self-pay

## 2022-03-31 NOTE — Telephone Encounter (Signed)
Reviewed patient chart- patient will stop the vantin per chart notes- patient is getting her Fosfoymycin delivered to her this week.  Patient understands to restart hipprex after completing fosfoymycin.

## 2022-03-31 NOTE — Telephone Encounter (Signed)
Calling to let doctor know the pharmacy filled the cefpodoxime.  Asking if she will need to continue with  the previous medication.  Please advise.

## 2022-04-18 ENCOUNTER — Ambulatory Visit (INDEPENDENT_AMBULATORY_CARE_PROVIDER_SITE_OTHER): Payer: Medicare Other | Admitting: Urology

## 2022-04-18 ENCOUNTER — Inpatient Hospital Stay: Payer: Medicare Other | Attending: Hematology

## 2022-04-18 ENCOUNTER — Ambulatory Visit (HOSPITAL_COMMUNITY)
Admission: RE | Admit: 2022-04-18 | Discharge: 2022-04-18 | Disposition: A | Discharge status: Home / Self Care | Payer: Medicare Other | Source: Ambulatory Visit | Attending: Hematology | Admitting: Hematology

## 2022-04-18 DIAGNOSIS — Z7901 Long term (current) use of anticoagulants: Secondary | ICD-10-CM | POA: Insufficient documentation

## 2022-04-18 DIAGNOSIS — Z79811 Long term (current) use of aromatase inhibitors: Secondary | ICD-10-CM | POA: Insufficient documentation

## 2022-04-18 DIAGNOSIS — R399 Unspecified symptoms and signs involving the genitourinary system: Secondary | ICD-10-CM

## 2022-04-18 DIAGNOSIS — N390 Urinary tract infection, site not specified: Secondary | ICD-10-CM | POA: Diagnosis not present

## 2022-04-18 DIAGNOSIS — M810 Age-related osteoporosis without current pathological fracture: Secondary | ICD-10-CM | POA: Diagnosis not present

## 2022-04-18 DIAGNOSIS — Z79899 Other long term (current) drug therapy: Secondary | ICD-10-CM | POA: Diagnosis not present

## 2022-04-18 DIAGNOSIS — D0512 Intraductal carcinoma in situ of left breast: Secondary | ICD-10-CM

## 2022-04-18 DIAGNOSIS — Z09 Encounter for follow-up examination after completed treatment for conditions other than malignant neoplasm: Secondary | ICD-10-CM

## 2022-04-18 DIAGNOSIS — I4891 Unspecified atrial fibrillation: Secondary | ICD-10-CM | POA: Insufficient documentation

## 2022-04-18 DIAGNOSIS — Z803 Family history of malignant neoplasm of breast: Secondary | ICD-10-CM | POA: Insufficient documentation

## 2022-04-18 DIAGNOSIS — E559 Vitamin D deficiency, unspecified: Secondary | ICD-10-CM

## 2022-04-18 LAB — COMPREHENSIVE METABOLIC PANEL
ALT: 14 U/L (ref 0–44)
AST: 21 U/L (ref 15–41)
Albumin: 3.7 g/dL (ref 3.5–5.0)
Alkaline Phosphatase: 59 U/L (ref 38–126)
Anion gap: 8 (ref 5–15)
BUN: 18 mg/dL (ref 8–23)
CO2: 25 mmol/L (ref 22–32)
Calcium: 9.1 mg/dL (ref 8.9–10.3)
Chloride: 103 mmol/L (ref 98–111)
Creatinine, Ser: 0.9 mg/dL (ref 0.44–1.00)
GFR, Estimated: >60 mL/min (ref >60)
Glucose, Bld: 105 mg/dL — ABNORMAL HIGH (ref 70–99)
Potassium: 4 mmol/L (ref 3.5–5.1)
Sodium: 136 mmol/L (ref 135–145)
Total Bilirubin: 0.5 mg/dL (ref 0.3–1.2)
Total Protein: 7.5 g/dL (ref 6.5–8.1)

## 2022-04-18 LAB — CBC WITH DIFFERENTIAL/PLATELET
Abs Immature Granulocytes: 0.02 10*3/uL (ref 0.00–0.07)
Basophils Absolute: 0 10*3/uL (ref 0.0–0.1)
Basophils Relative: 1 %
Eosinophils Absolute: 0 10*3/uL (ref 0.0–0.5)
Eosinophils Relative: 1 %
HCT: 37.9 % (ref 36.0–46.0)
Hemoglobin: 12.4 g/dL (ref 12.0–15.0)
Immature Granulocytes: 0 %
Lymphocytes Relative: 38 %
Lymphs Abs: 1.8 10*3/uL (ref 0.7–4.0)
MCH: 31.9 pg (ref 26.0–34.0)
MCHC: 32.7 g/dL (ref 30.0–36.0)
MCV: 97.4 fL (ref 80.0–100.0)
Monocytes Absolute: 0.6 10*3/uL (ref 0.1–1.0)
Monocytes Relative: 12 %
Neutro Abs: 2.3 10*3/uL (ref 1.7–7.7)
Neutrophils Relative %: 48 %
Platelets: 262 10*3/uL (ref 150–400)
RBC: 3.89 MIL/uL (ref 3.87–5.11)
RDW: 13.2 % (ref 11.5–15.5)
WBC: 4.7 10*3/uL (ref 4.0–10.5)
nRBC: 0 % (ref 0.0–0.2)

## 2022-04-18 LAB — VITAMIN D 25 HYDROXY (VIT D DEFICIENCY, FRACTURES): Vit D, 25-Hydroxy: 32.63 ng/mL (ref 30–100)

## 2022-04-18 MED ORDER — DOXYCYCLINE HYCLATE 100 MG PO CAPS: 100.0000 mg | ORAL_CAPSULE | Freq: Two times a day (BID) | ORAL | 0 refills | Status: DC

## 2022-04-18 NOTE — Progress Notes (Unsigned)
Patient in office complainig of uti symptoms. UA reviewed with Dr. Alyson Ingles. Doxycycline po bid x 7 days sent to pharmacy. Patient called with no answer. Detailed message left.

## 2022-04-19 LAB — URINALYSIS, ROUTINE W REFLEX MICROSCOPIC
Bilirubin, UA: NEGATIVE
Glucose, UA: NEGATIVE
Ketones, UA: NEGATIVE
Nitrite, UA: NEGATIVE
Protein,UA: NEGATIVE
Specific Gravity, UA: 1.01 (ref 1.005–1.030)
Urobilinogen, Ur: 0.2 mg/dL (ref 0.2–1.0)
pH, UA: 5.5 (ref 5.0–7.5)

## 2022-04-19 LAB — MICROSCOPIC EXAMINATION: WBC, UA: >30 /hpf — AB (ref 0–5)

## 2022-04-21 ENCOUNTER — Telehealth: Payer: Self-pay

## 2022-04-21 LAB — URINE CULTURE

## 2022-04-21 MED ORDER — AMOXICILLIN-POT CLAVULANATE 875-125 MG PO TABS: 1.0000 | ORAL_TABLET | Freq: Two times a day (BID) | ORAL | 0 refills | Status: DC

## 2022-04-21 NOTE — Telephone Encounter (Signed)
Patient called and made aware of positive urine culture and antibiotic change. Augmentin sent to the pharmacy per Dr. Alyson Ingles.

## 2022-04-26 ENCOUNTER — Inpatient Hospital Stay (HOSPITAL_BASED_OUTPATIENT_CLINIC_OR_DEPARTMENT_OTHER): Payer: Medicare Other | Admitting: Hematology

## 2022-04-26 VITALS — BP 147/95 | HR 89 | Temp 99.0°F | Resp 18 | Ht 62.0 in | Wt 167.5 lb

## 2022-04-26 DIAGNOSIS — D0512 Intraductal carcinoma in situ of left breast: Secondary | ICD-10-CM

## 2022-04-26 DIAGNOSIS — Z79811 Long term (current) use of aromatase inhibitors: Secondary | ICD-10-CM | POA: Diagnosis not present

## 2022-04-26 DIAGNOSIS — Z79899 Other long term (current) drug therapy: Secondary | ICD-10-CM | POA: Diagnosis not present

## 2022-04-26 DIAGNOSIS — I4891 Unspecified atrial fibrillation: Secondary | ICD-10-CM | POA: Diagnosis not present

## 2022-04-26 DIAGNOSIS — M810 Age-related osteoporosis without current pathological fracture: Secondary | ICD-10-CM | POA: Diagnosis not present

## 2022-04-26 DIAGNOSIS — Z7901 Long term (current) use of anticoagulants: Secondary | ICD-10-CM | POA: Diagnosis not present

## 2022-04-26 NOTE — Patient Instructions (Addendum)
[place]  Discharge Instructions  You were seen and examined today by Dr. Delton Coombes.  Dr. Delton Coombes discussed your most recent lab work which revealed that everything looks good except your bone density test is showing osteoporosis.  Dr. Delton Coombes recommends starting Prolia to help strengthen your bones. You will need to take Calcium while you are taking these injections and be sure to have regular dental exams. Dr. Delton Coombes wants you to discuss starting Prolia with your dentist.  Continue taking Anastrozole as prescribed.  Follow-up as scheduled in 3 months with labs.    Thank you for choosing [facility] to provide your oncology and hematology care.   To afford each patient quality time with our provider, please arrive at least 15 minutes before your scheduled appointment time. You may need to reschedule your appointment if you arrive late (10 or more minutes). Arriving late affects you and other patients whose appointments are after yours.  Also, if you miss three or more appointments without notifying the office, you may be dismissed from the clinic at the provider's discretion.    Again, thank you for choosing [facility].  Our hope is that these requests will decrease the amount of time that you wait before being seen by our physicians.   If you have a lab appointment with the [facility] please come in thru the Main Entrance and check in at the main information desk.           _____________________________________________________________  Should you have questions after your visit to [facility], please contact our office at [phone] and follow the prompts.  Our office hours are 8:00 a.m. to 4:30 p.m. Monday - Thursday and 8:00 a.m. to 2:30 p.m. Friday.  Please note that voicemails left after 4:00 p.m. may not be returned until the following business day.  We are closed weekends and all  major holidays.  You do have access to a nurse 24-7, just call the main number to the clinic [phone] and do not press any options, hold on the line and a nurse will answer the phone.    For prescription refill requests, have your pharmacy contact our office and allow 72 hours.    Masks are optional in the cancer centers. If you would like for your care team to wear a mask while they are taking care of you, please let them know. You may have one support person who is at least 83 years old accompany you for your appointments.

## 2022-04-26 NOTE — Progress Notes (Signed)
[facility] [address], [place] [address]    Clinic Day:  04/26/2022  Referring physician: Glenda Chroman, MD  Patient Care Team: Glenda Chroman, MD as PCP - General (Internal Medicine) Derek Jack, MD as Medical Oncologist (Medical Oncology) Brien Mates, RN as Oncology Nurse Navigator (Medical Oncology)   ASSESSMENT & PLAN:   Assessment: 1.  High-grade DCIS of the left breast: - Bilateral diagnostic mammogram (10/27/2021): 0.7 cm group of calcifications in the upper central left breast. - Left breast core biopsy 12:00 (11/10/2021): High-grade DCIS, solid type with necrosis.  Negative for invasive carcinoma.  ER 40%, PR 5% strong staining. - Left breast lumpectomy (12/17/2021): Grade 3 DCIS with central comedonecrosis and microcalcifications, 1.4 cm.  Margins negative.  Distance from DCIS to closest margin 4 mm.  pTis.  ER 40%, PR 5% - Anastrozole started on 01/18/2022. - She met with Dr. Lynnette Caffey of radiation oncology.  As her prognosis was excellent, she was recommended against radiation.   2.  Social/family history: - She lives at home by herself.  She is independent of ADLs and IADLs.  She is accompanied by her daughter today.  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  She is a never smoker. - Sister had breast cancer in her late 24s.  Paternal aunt died of breast cancer.    Plan: 1.  High-grade DCIS of the left breast: - She is taking anastrozole daily as prescribed. - She reported joint pains getting worse in the knees, legs and back.  In the last few weeks, they are slightly getting better.  Denied any hot flashes.  She takes Tylenol for the joint pains. - We reviewed labs today which showed normal LFTs and CBC. - Continue anastrozole daily.  RTC 3 months.  If any worsening of joint pains, will consider switching to a different medication.   2.  Osteoporosis (DEXA 04/18/2022 T-3.8): - We  discussed the DEXA scan results which showed osteoporosis.  Vitamin D level is normal at 32. - We talked about initiating her on Prolia every 6 months.  We talked about the side effects in detail including hypocalcemia and rare chance of osteonecrosis of the jaw. - She has all her teeth and sees her dentist on regular basis.  Her next dental appointment is end of April.  We will wait until she gets her dental work done. - RTC 3 months with BMP to start Prolia.  Orders Placed This Encounter  Procedures   Basic metabolic panel    Standing Status:   Future    Standing Expiration Date:   04/26/2023      I,Alexis Herring,acting as a scribe for Derek Jack, MD.,have documented all relevant documentation on the behalf of Derek Jack, MD,as directed by  Derek Jack, MD while in the presence of Derek Jack, MD.   I, Derek Jack MD, have reviewed the above documentation for accuracy and completeness, and I agree with the above.   Derek Jack, MD   3/12/20245:05 PM  CHIEF COMPLAINT:   Diagnosis: DCIS of the left breast   Cancer Staging  No matching staging information was found for the patient.   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 No pathogenic variants identified on the [facility] Common Hereditary Cancers+RNA panel. The report date is 03/06/2022.  The Common Hereditary Cancers Panel + RNA offered by [facility] includes sequencing and/or deletion duplication testing of the following 48 genes: APC*, ATM*, AXIN2, BAP1, BARD1, BMPR1A, BRCA1, BRCA2, BRIP1, CDH1, CDK4, CDKN2A (p14ARF), CDKN2A (p16INK4a), CHEK2, CTNNA1, DICER1*, EPCAM*, FH*, GREM1*, HOXB13, KIT, MBD4, MEN1*, MLH1*,  MSH2*, MSH3*, MSH6*, MUTYH, NF1*, NTHL1, PALB2, PDGFRA, PMS2*, POLD1*, POLE, PTEN*, RAD51C, RAD51D, SDHA*, SDHB, SDHC*, SDHD, SMAD4, SMARCA4, STK11, TP53, TSC1*, TSC2, VHL.       INTERVAL HISTORY:   Kassi is a 82 y.o. female presenting to clinic today for follow up of DCIS of the left breast. She was last seen by me on 01/18/22 for consult.  Today, she states that she is doing well overall. Her appetite level is at 100%. Her energy level is at 40%. She reported worsening of joint pains in the back, knees and legs since the start of anastrozole.  But in the last few weeks the pains have started improving.  Denied any hot flashes.   PAST MEDICAL HISTORY:   Past Medical History: Past Medical History:  Diagnosis Date   Arthritis    Atrial fibrillation ([facility])    Chronic headaches 11/29/2016   Dysrhythmia     Surgical History: Past Surgical History:  Procedure Laterality Date   CATARACT EXTRACTION, BILATERAL     Ruptured ovary     left ovary in the 60s    Social History: Social History   Socioeconomic History   Marital status: Widowed    Spouse name: Not on file   Number of children: Not on file   Years of education: Not on file   Highest education level: Not on file  Occupational History   Not on file  Tobacco Use   Smoking status: Never   Smokeless tobacco: Never  Substance and Sexual Activity   Alcohol use: No   Drug use: No   Sexual activity: Not on file  Other Topics Concern   Not on file  Social History Narrative   Not on file   Social Determinants of Health   Financial Resource Strain: Not on file  Food Insecurity: No Food Insecurity (01/18/2022)   Hunger Vital Sign    Worried About Running Out of Food in the Last Year: Never true    Ran Out of Food in the Last Year: Never true  Transportation Needs: No Transportation Needs (01/18/2022)   PRAPARE - Hydrologist (Medical): No    Lack of Transportation (Non-Medical): No  Physical  Activity: Not on file  Stress: Not on file  Social Connections: Not on file  Intimate Partner Violence: Not At Risk (01/18/2022)   Humiliation, Afraid, Rape, and Kick questionnaire    Fear of Current or Ex-Partner: No    Emotionally Abused: No    Physically Abused: No    Sexually Abused: No    Family History: Family History  Problem Relation Age of Onset   Breast cancer Sister 59 - 46   Colon cancer Maternal Aunt 2 - 47   Brain cancer Maternal Uncle 70 - 80   Breast cancer Paternal Aunt 40 - 10       recurrence, d. 25s    Current Medications:  Current Outpatient Medications:    acetaminophen (TYLENOL) 325 MG tablet, Take 650 mg by mouth every 6 (six) hours as needed for moderate pain., Disp: ,  Rfl:    amoxicillin-clavulanate (AUGMENTIN) 875-125 MG tablet, Take 1 tablet by mouth 2 (two) times daily., Disp: 14 tablet, Rfl: 0   anastrozole (ARIMIDEX) 1 MG tablet, Take 1 tablet (1 mg total) by mouth daily., Disp: 30 tablet, Rfl: 6   Calcium Carb-Cholecalciferol (CALCIUM + VITAMIN D3 PO), Take 1 tablet by mouth daily., Disp: , Rfl:    ELIQUIS 5 MG TABS tablet, Take 5 mg by mouth 2 (two) times daily., Disp: , Rfl:    latanoprost (XALATAN) 0.005 % ophthalmic solution, Place 1 drop into both eyes at bedtime., Disp: , Rfl:    methenamine (HIPREX) 1 g tablet, Take 1 tablet (1 g total) by mouth 2 (two) times daily with a meal., Disp: 60 tablet, Rfl: 11   metoprolol succinate (TOPROL-XL) 25 MG 24 hr tablet, Take 12.5 mg by mouth 2 (two) times daily., Disp: , Rfl:    pantoprazole (PROTONIX) 40 MG tablet, Take 40 mg by mouth daily., Disp: , Rfl:    Allergies: Allergies  Allergen Reactions   Macrodantin [Nitrofurantoin] Rash   Sulfa Antibiotics     Headaches.   Tetracyclines & Related     Yeast infection    REVIEW OF SYSTEMS:   Review of Systems  Constitutional:  Negative for chills, fatigue and fever.  HENT:   Negative for lump/mass, mouth sores, nosebleeds, sore throat and trouble  swallowing.   Eyes:  Negative for eye problems.  Respiratory:  Positive for shortness of breath. Negative for cough.   Cardiovascular:  Negative for chest pain, leg swelling and palpitations.  Gastrointestinal:  Negative for abdominal pain, constipation, diarrhea, nausea and vomiting.  Genitourinary:  Negative for bladder incontinence, difficulty urinating, dysuria, frequency, hematuria and nocturia.   Musculoskeletal:  Positive for arthralgias and back pain. Negative for flank pain, myalgias and neck pain.  Skin:  Negative for itching and rash.  Neurological:  Positive for dizziness and headaches. Negative for numbness.  Hematological:  Does not bruise/bleed easily.  Psychiatric/Behavioral:  Negative for depression, sleep disturbance and suicidal ideas. The patient is not nervous/anxious.   All other systems reviewed and are negative.    VITALS:   Blood pressure (!) 147/95, pulse 89, temperature 99 F (37.2 C), temperature source Tympanic, resp. rate 18, height '5\' 2"'$  (1.575 m), weight 167 lb 8 oz (76 kg), SpO2 96 %.  Wt Readings from Last 3 Encounters:  04/26/22 167 lb 8 oz (76 kg)  01/18/22 168 lb 11.2 oz (76.5 kg)  12/30/21 168 lb (76.2 kg)    Body mass index is 30.64 kg/m.  Performance status (ECOG): 1 - Symptomatic but completely ambulatory  PHYSICAL EXAM:   Physical Exam Vitals and nursing note reviewed. Exam conducted with a chaperone present.  Constitutional:      Appearance: Normal appearance.  Cardiovascular:     Rate and Rhythm: Normal rate and regular rhythm.     Pulses: Normal pulses.     Heart sounds: Normal heart sounds.  Pulmonary:     Effort: Pulmonary effort is normal.     Breath sounds: Normal breath sounds.  Abdominal:     Palpations: Abdomen is soft. There is no hepatomegaly, splenomegaly or mass.     Tenderness: There is no abdominal tenderness.  Musculoskeletal:     Right lower leg: No edema.     Left lower leg: No edema.  Lymphadenopathy:      Cervical: No cervical adenopathy.     Right cervical: No superficial, deep or posterior cervical adenopathy.  Left cervical: No superficial, deep or posterior cervical adenopathy.     Upper Body:     Right upper body: No supraclavicular or axillary adenopathy.     Left upper body: No supraclavicular or axillary adenopathy.  Neurological:     General: No focal deficit present.     Mental Status: She is alert and oriented to person, place, and time.  Psychiatric:        Mood and Affect: Mood normal.        Behavior: Behavior normal.     LABS:      Latest Ref Rng & Units 04/18/2022    1:31 PM  CBC  WBC 4.0 - 10.5 K/uL 4.7   Hemoglobin 12.0 - 15.0 g/dL 12.4   Hematocrit 36.0 - 46.0 % 37.9   Platelets 150 - 400 K/uL 262       Latest Ref Rng & Units 04/18/2022    1:31 PM  CMP  Glucose 70 - 99 mg/dL 105   BUN 8 - 23 mg/dL 18   Creatinine 0.44 - 1.00 mg/dL 0.90   Sodium 135 - 145 mmol/L 136   Potassium 3.5 - 5.1 mmol/L 4.0   Chloride 98 - 111 mmol/L 103   CO2 22 - 32 mmol/L 25   Calcium 8.9 - 10.3 mg/dL 9.1   Total Protein 6.5 - 8.1 g/dL 7.5   Total Bilirubin 0.3 - 1.2 mg/dL 0.5   Alkaline Phos 38 - 126 U/L 59   AST 15 - 41 U/L 21   ALT 0 - 44 U/L 14      No results found for: "CEA1", "CEA" / No results found for: "CEA1", "CEA" No results found for: "PSA1" No results found for: "[id number]" No results found for: "CAN125"  No results found for: "TOTALPROTELP", "ALBUMINELP", "A1GS", "A2GS", "BETS", "BETA2SER", "GAMS", "MSPIKE", "SPEI" No results found for: "TIBC", "FERRITIN", "IRONPCTSAT" No results found for: "LDH"   STUDIES:   DG Bone Density  Result Date: 04/18/2022 EXAM: DUAL X-RAY ABSORPTIOMETRY (DXA) FOR BONE MINERAL DENSITY IMPRESSION: Your patient Sesley Dollison completed a BMD test on 04/18/2022 using the [employer] (software version: 14.10) manufactured by [employer]. The following summarizes the results of our evaluation. Technologist: [facility]  PATIENT BIOGRAPHICAL: Name: Annamay, Lanzillo Patient ID: [id number] Birth Date: March 14, 1940 Height: 62.0 in. Gender: Female Exam Date: 04/18/2022 Weight: 168.7 lbs. Indications: Advanced Age, Breast Ca, Caucasian, Height Loss, Post Menopausal, Unilateral oophrectomy Fractures: Treatments: Anastrozole, Calcium, Vitamin D DENSITOMETRY RESULTS: Site         Region     Measured Date Measured Age WHO Classification Young Adult T-score BMD         %Change vs. Previous Significant Change (*) DualFemur Total Left 04/18/2021 83.1 Osteopenia -1.7 0.795 g/cm2 - - DualFemur Total Mean 04/18/2022 83.1 Osteopenia -1.5 0.822 g/cm2 - - Left Forearm Radius 33% 04/18/2022 83.1 Osteoporosis -3.8 0.441 g/cm2 - - ASSESSMENT: The BMD measured at Forearm Radius 33% is 0.441 g/cm2 with a T-score of -3.8. This patient is considered osteoporotic according to [facility]) criteria. The scan quality is good. Lumbar spine was excluded due to advanced degenerative changes. World Pharmacologist [facility]) criteria for post-menopausal, Caucasian Women: Normal:       T-score at or above -1 SD Osteopenia:   T-score between -1 and -2.5 SD Osteoporosis: T-score at or below -2.5 SD RECOMMENDATIONS: 1. All patients should optimize calcium and vitamin D intake. 2. Consider FDA-approved medical therapies in postmenopausal women  and med aged 81 years and older, based on the following: a. A hip or vertebral (clinical or morphometric) fracture b. T-score< -2.5 at the femoral neck or spine after appropriate evaluation to exclude secondary causes c. Low bone mass (T-score between -1.0 and -2.5 at the femoral neck or spine) and a 10-year probability of a hip fracture > 3% or a 10-year probability of a major osteoporosis-related fracture > 20% based on the [place]-adapted WHO algorithm d. Clinician judgment and/or patient preferences may indicate treatment for people with 10-year fracture probabilities above or below these levels FOLLOW-UP: Patients  with diagnosis of osteoporsis or at high risk for fracture should have regular bone mineral density tests. For patients eligible for Medicare, routine testing is allowed once every 2 years. The testing frequency can be increased to one year for patients who have rapidly progressing disease, those who are receiving or discontinuing medical therapy to restore bone mass, or have additional risk factors. I have reviewed this report, and agree with the above findings. [facility] Electronically Signed   By: Zerita Boers M.D.   On: 04/18/2022 13:27

## 2022-04-29 DIAGNOSIS — H401431 Capsular glaucoma with pseudoexfoliation of lens, bilateral, mild stage: Secondary | ICD-10-CM | POA: Diagnosis not present

## 2022-04-29 DIAGNOSIS — H35033 Hypertensive retinopathy, bilateral: Secondary | ICD-10-CM | POA: Diagnosis not present

## 2022-04-29 DIAGNOSIS — H26493 Other secondary cataract, bilateral: Secondary | ICD-10-CM | POA: Diagnosis not present

## 2022-04-29 DIAGNOSIS — H35363 Drusen (degenerative) of macula, bilateral: Secondary | ICD-10-CM | POA: Diagnosis not present

## 2022-05-30 ENCOUNTER — Telehealth: Payer: Self-pay

## 2022-05-30 ENCOUNTER — Telehealth: Payer: Self-pay | Admitting: *Deleted

## 2022-05-30 ENCOUNTER — Ambulatory Visit (INDEPENDENT_AMBULATORY_CARE_PROVIDER_SITE_OTHER): Payer: Medicare Other | Admitting: Urology

## 2022-05-30 DIAGNOSIS — R300 Dysuria: Secondary | ICD-10-CM

## 2022-05-30 DIAGNOSIS — N390 Urinary tract infection, site not specified: Secondary | ICD-10-CM

## 2022-05-30 LAB — URINALYSIS, ROUTINE W REFLEX MICROSCOPIC
Bilirubin, UA: NEGATIVE
Glucose, UA: NEGATIVE
Ketones, UA: NEGATIVE
Nitrite, UA: NEGATIVE
Protein,UA: NEGATIVE
Specific Gravity, UA: 1.01 (ref 1.005–1.030)
Urobilinogen, Ur: 0.2 mg/dL (ref 0.2–1.0)
pH, UA: 5.5 (ref 5.0–7.5)

## 2022-05-30 LAB — MICROSCOPIC EXAMINATION: WBC, UA: >30 /hpf — AB (ref 0–5)

## 2022-05-30 MED ORDER — AMOXICILLIN-POT CLAVULANATE 500-125 MG PO TABS: 1.0000 | ORAL_TABLET | Freq: Two times a day (BID) | ORAL | 0 refills | Status: DC

## 2022-05-30 NOTE — Telephone Encounter (Signed)
Patient started on Augmentin po bid x 3 days. Culture sent. Patient called and made aware.

## 2022-05-30 NOTE — Telephone Encounter (Signed)
Patient added to [place] schedule for ua/uc 

## 2022-05-30 NOTE — Telephone Encounter (Signed)
Patient called to inform that her joint pain has become unmanageable on Anastrozole.  Will consult with Dr. Ellin Saba regarding therapy.

## 2022-05-30 NOTE — Progress Notes (Signed)
UA drop off due to dysuria. UA sent for culture. Ua results reviewed with Dr. Mena Goes. Patient started on Augmentin po bid x 3 days.  Patient called and made aware.

## 2022-05-30 NOTE — Telephone Encounter (Signed)
Patient called advising she is having dysuria along with cramping after urinating. She advised she has the symptoms when she has had UTI's in the past. Patient advised she has a hx of frequent UTI's. Patient advised she would like to drop off a urine sample today.

## 2022-05-31 ENCOUNTER — Other Ambulatory Visit: Payer: Self-pay | Admitting: *Deleted

## 2022-05-31 ENCOUNTER — Encounter: Payer: Self-pay | Admitting: Hematology

## 2022-05-31 DIAGNOSIS — D0512 Intraductal carcinoma in situ of left breast: Secondary | ICD-10-CM

## 2022-05-31 MED ORDER — TAMOXIFEN CITRATE 20 MG PO TABS
20.0000 mg | ORAL_TABLET | Freq: Every day | ORAL | 3 refills | Status: DC
Start: 2022-05-31 — End: 2022-09-19

## 2022-05-31 NOTE — Telephone Encounter (Signed)
Patient aware.

## 2022-06-03 NOTE — Telephone Encounter (Signed)
Dr. Mena Goes started patient on Augmentin x3 days.  She has finished the abx her symptoms improved but she is still having lower abdominal cramping.  She is asking if she needs to extend Augmentin rx or start back on daily Hiprex.  Urine culture still pending.  Please advise.

## 2022-06-03 NOTE — Telephone Encounter (Signed)
Verbal from Dr. Ronne Binning to have pt start her daily Hiprex back.  Patient aware of MD response and that we will call with culture results.  Patient voiced understanding.

## 2022-06-04 LAB — URINE CULTURE

## 2022-06-08 ENCOUNTER — Telehealth: Payer: Self-pay

## 2022-06-08 ENCOUNTER — Ambulatory Visit (INDEPENDENT_AMBULATORY_CARE_PROVIDER_SITE_OTHER): Payer: Medicare Other

## 2022-06-08 DIAGNOSIS — N390 Urinary tract infection, site not specified: Secondary | ICD-10-CM

## 2022-06-08 DIAGNOSIS — R399 Unspecified symptoms and signs involving the genitourinary system: Secondary | ICD-10-CM

## 2022-06-08 DIAGNOSIS — N3001 Acute cystitis with hematuria: Secondary | ICD-10-CM

## 2022-06-08 DIAGNOSIS — R339 Retention of urine, unspecified: Secondary | ICD-10-CM

## 2022-06-08 LAB — MICROSCOPIC EXAMINATION: WBC, UA: >30 /hpf — AB (ref 0–5)

## 2022-06-08 LAB — URINALYSIS, ROUTINE W REFLEX MICROSCOPIC
Bilirubin, UA: NEGATIVE
Glucose, UA: NEGATIVE
Ketones, UA: NEGATIVE
Nitrite, UA: NEGATIVE
Protein,UA: NEGATIVE
Specific Gravity, UA: 1.01 (ref 1.005–1.030)
Urobilinogen, Ur: 0.2 mg/dL (ref 0.2–1.0)
pH, UA: 5.5 (ref 5.0–7.5)

## 2022-06-08 MED ORDER — AMOXICILLIN-POT CLAVULANATE 500-125 MG PO TABS
1.0000 | ORAL_TABLET | Freq: Two times a day (BID) | ORAL | 0 refills | Status: DC
Start: 2022-06-08 — End: 2022-08-02

## 2022-06-08 NOTE — Progress Notes (Cosign Needed Addendum)
Patient presents today with complaints of  UTI symptoms.  UA and Culture done today.  Dr. Ronne Binning reviewed results and Augmentin 500-125 mg bid X 7 days .  Patient aware of MD recommendations and that we will reach out with culture results.      [id number], CMA

## 2022-06-08 NOTE — Telephone Encounter (Signed)
Patient is aware that Rx for abt was sent to pharmacy. Patient voiced understanding

## 2022-06-08 NOTE — Telephone Encounter (Signed)
Patient is aware to do a UA/UC drop off. Patient voiced understanding

## 2022-06-09 NOTE — Telephone Encounter (Signed)
Patient aware of MD recommendations and that we will reach out with culture results. Patient voiced understanding

## 2022-06-13 LAB — URINE CULTURE

## 2022-07-08 ENCOUNTER — Ambulatory Visit (INDEPENDENT_AMBULATORY_CARE_PROVIDER_SITE_OTHER): Payer: Medicare Other | Admitting: Urology

## 2022-07-08 ENCOUNTER — Encounter: Payer: Self-pay | Admitting: Urology

## 2022-07-08 VITALS — BP 152/82 | HR 59

## 2022-07-08 DIAGNOSIS — Z87440 Personal history of urinary (tract) infections: Secondary | ICD-10-CM | POA: Diagnosis not present

## 2022-07-08 DIAGNOSIS — R82998 Other abnormal findings in urine: Secondary | ICD-10-CM

## 2022-07-08 DIAGNOSIS — N3289 Other specified disorders of bladder: Secondary | ICD-10-CM

## 2022-07-08 DIAGNOSIS — R1010 Upper abdominal pain, unspecified: Secondary | ICD-10-CM | POA: Diagnosis not present

## 2022-07-08 DIAGNOSIS — N390 Urinary tract infection, site not specified: Secondary | ICD-10-CM | POA: Diagnosis not present

## 2022-07-08 LAB — URINALYSIS, ROUTINE W REFLEX MICROSCOPIC
Bilirubin, UA: NEGATIVE
Glucose, UA: NEGATIVE
Ketones, UA: NEGATIVE
Nitrite, UA: NEGATIVE
Protein,UA: NEGATIVE
Specific Gravity, UA: 1.015 (ref 1.005–1.030)
Urobilinogen, Ur: 0.2 mg/dL (ref 0.2–1.0)
pH, UA: 5.5 (ref 5.0–7.5)

## 2022-07-08 LAB — MICROSCOPIC EXAMINATION: WBC, UA: >30 /hpf — AB (ref 0–5)

## 2022-07-08 LAB — BLADDER SCAN AMB NON-IMAGING

## 2022-07-08 MED ORDER — AMOXICILLIN-POT CLAVULANATE 875-125 MG PO TABS
1.0000 | ORAL_TABLET | Freq: Two times a day (BID) | ORAL | 0 refills | Status: DC
Start: 2022-07-08 — End: 2022-08-02

## 2022-07-08 MED ORDER — AMOXICILLIN-POT CLAVULANATE 500-125 MG PO TABS
1.0000 | ORAL_TABLET | Freq: Every day | ORAL | 5 refills | Status: DC
Start: 2022-07-08 — End: 2022-10-10

## 2022-07-08 NOTE — Progress Notes (Unsigned)
07/08/2022 11:42 AM   Sherry Smith 1940/10/06 [id number]  Referring provider: Ignatius Specking, MD [address] [place],  [place] [address]  Chief Complaint  Patient presents with   Urinary Tract Infection    recurrent    HPI: Ms Supinger is a 82yo here for followup for recurrent UTI. Sh has had 3 UTIs since last visit. She is on hiprex daily. UA today is concerning for infection. She denies any worsening LUTS. She has been having bladder spasms and suprapubic discomfort for the past 4-5 days. No hematuria. No fevers.    PMH: Past Medical History:  Diagnosis Date   Arthritis    Atrial fibrillation [facility])    Chronic headaches 11/29/2016   Dysrhythmia     Surgical History: Past Surgical History:  Procedure Laterality Date   CATARACT EXTRACTION, BILATERAL     Ruptured ovary     left ovary in the 60s    Home Medications:  Allergies as of 07/08/2022       Reactions   Macrodantin [nitrofurantoin] Rash   Sulfa Antibiotics    Headaches.   Tetracyclines & Related    Yeast infection        Medication List        Accurate as of Jul 08, 2022 11:42 AM. If you have any questions, ask your nurse or doctor.          acetaminophen 325 MG tablet Commonly known as: TYLENOL Take 650 mg by mouth every 6 (six) hours as needed for moderate pain.   amoxicillin-clavulanate 875-125 MG tablet Commonly known as: AUGMENTIN Take 1 tablet by mouth 2 (two) times daily.   amoxicillin-clavulanate 500-125 MG tablet Commonly known as: Augmentin Take 1 tablet by mouth 2 (two) times daily.   amoxicillin-clavulanate 500-125 MG tablet Commonly known as: Augmentin Take 1 tablet by mouth 2 (two) times daily.   CALCIUM + VITAMIN D3 PO Take 1 tablet by mouth daily.   Eliquis 5 MG Tabs tablet Generic drug: apixaban Take 5 mg by mouth 2 (two) times daily.   latanoprost 0.005 % ophthalmic solution Commonly known as: XALATAN Place 1 drop into both eyes at bedtime.   methenamine 1 g  tablet Commonly known as: Hiprex Take 1 tablet (1 g total) by mouth 2 (two) times daily with a meal.   metoprolol succinate 25 MG 24 hr tablet Commonly known as: TOPROL-XL Take 12.5 mg by mouth 2 (two) times daily.   metoprolol tartrate 25 MG tablet Commonly known as: LOPRESSOR Take 12.5 mg by mouth 2 (two) times daily.   pantoprazole 40 MG tablet Commonly known as: PROTONIX Take 40 mg by mouth daily.   tamoxifen 20 MG tablet Commonly known as: NOLVADEX Take 1 tablet (20 mg total) by mouth daily.        Allergies:  Allergies  Allergen Reactions   Macrodantin [Nitrofurantoin] Rash   Sulfa Antibiotics     Headaches.   Tetracyclines & Related     Yeast infection    Family History: Family History  Problem Relation Age of Onset   Breast cancer Sister 42 - 42   Colon cancer Maternal Aunt 62 - 32   Brain cancer Maternal Uncle 2 - 80   Breast cancer Paternal Aunt 41 - 45       recurrence, d. 77s    Social History:  reports that she has never smoked. She has never used smokeless tobacco. She reports that she does not drink alcohol and does not use  drugs.  ROS: All other review of systems were reviewed and are negative except what is noted above in HPI  Physical Exam: BP (!) 152/82   Pulse (!) 59   Constitutional:  Alert and oriented, No acute distress. HEENT: [place] AT, moist mucus membranes.  Trachea midline, no masses. Cardiovascular: No clubbing, cyanosis, or edema. Respiratory: Normal respiratory effort, no increased work of breathing. GI: Abdomen is soft, nontender, nondistended, no abdominal masses GU: No CVA tenderness.  Lymph: No cervical or inguinal lymphadenopathy. Skin: No rashes, bruises or suspicious lesions. Neurologic: Grossly intact, no focal deficits, moving all 4 extremities. Psychiatric: Normal mood and affect.  Laboratory Data: Lab Results  Component Value Date   WBC 4.7 04/18/2022   HGB 12.4 04/18/2022   HCT 37.9 04/18/2022   MCV 97.4  04/18/2022   PLT 262 04/18/2022    Lab Results  Component Value Date   CREATININE 0.90 04/18/2022    No results found for: "PSA"  No results found for: "TESTOSTERONE"  No results found for: "HGBA1C"  Urinalysis    Component Value Date/Time   APPEARANCEUR Cloudy (A) 06/08/2022 1316   GLUCOSEU Negative 06/08/2022 1316   BILIRUBINUR Negative 06/08/2022 1316   PROTEINUR Negative 06/08/2022 1316   NITRITE Negative 06/08/2022 1316   LEUKOCYTESUR 2+ (A) 06/08/2022 1316    Lab Results  Component Value Date   LABMICR See below: 06/08/2022   WBCUA >30 (A) 06/08/2022   LABEPIT 0-10 06/08/2022   MUCUS Present 09/09/2021   BACTERIA Many (A) 06/08/2022    Pertinent Imaging:  No results found for this or any previous visit.  No results found for this or any previous visit.  No results found for this or any previous visit.  No results found for this or any previous visit.  No results found for this or any previous visit.  No valid procedures specified. No results found for this or any previous visit.  Results for orders placed in visit on 09/09/21  CT RENAL STONE STUDY  Narrative CLINICAL DATA:  Lower abdominal and flank pain for couple months, bladder pain, acute cystitis with hematuria, recurrent UTIs, suspected kidney stone  EXAM: CT ABDOMEN AND PELVIS WITHOUT CONTRAST  TECHNIQUE: Multidetector CT imaging of the abdomen and pelvis was performed following the standard protocol without IV contrast.  RADIATION DOSE REDUCTION: This exam was performed according to the departmental dose-optimization program which includes automated exposure control, adjustment of the mA and/or kV according to patient size and/or use of iterative reconstruction technique.  COMPARISON:  None  FINDINGS: Lower chest: Elevation of LEFT diaphragm with LEFT basilar atelectasis  Hepatobiliary: Large calcified gallstone in gallbladder 2.1 cm diameter. Gallbladder and liver otherwise  normal appearance  Pancreas: Atrophic pancreas without mass  Spleen: Normal appearance  Adrenals/Urinary Tract: Adrenal glands normal appearance. Tiny peripelvic cysts LEFT kidney; no follow-up imaging recommended. Kidneys, ureters, and bladder otherwise normal appearance.  Stomach/Bowel: Appendix not visualized. No pericecal inflammatory process seen. Stomach decompressed. Large and small bowel loops unremarkable.  Vascular/Lymphatic: Atherosclerotic calcifications aorta and iliac arteries without aneurysm. No adenopathy.  Reproductive: Atrophic uterus and ovaries  Other: No free air or free fluid. LEFT inguinal hernia containing fat. Small umbilical hernia containing fat. No definite inflammatory process.  Musculoskeletal: Osseous demineralization. Rotatory scoliosis of the thoracolumbar spine. Scattered degenerative disc and facet disease changes thoracolumbar spine.  IMPRESSION: Cholelithiasis without evidence of acute cholecystitis.  LEFT inguinal and umbilical hernias containing fat.  No acute intra-abdominal or intrapelvic abnormalities.  Aortic Atherosclerosis (ICD10-I70.0).  Electronically Signed By: Ulyses Southward M.D. On: 09/15/2021 17:05   Assessment & Plan:    1. Recurrent UTI -urine for culture -augementing BID for 7 days then augmentin 500mg  daily for prophylaxis - Urinalysis, Routine w reflex microscopic - BLADDER SCAN AMB NON-IMAGING   No follow-ups on file.  Wilkie Aye, MD  [facility] [place]

## 2022-07-12 ENCOUNTER — Encounter: Payer: Self-pay | Admitting: Urology

## 2022-07-12 DIAGNOSIS — Z299 Encounter for prophylactic measures, unspecified: Secondary | ICD-10-CM | POA: Diagnosis not present

## 2022-07-12 DIAGNOSIS — I4891 Unspecified atrial fibrillation: Secondary | ICD-10-CM | POA: Diagnosis not present

## 2022-07-12 DIAGNOSIS — C50912 Malignant neoplasm of unspecified site of left female breast: Secondary | ICD-10-CM | POA: Diagnosis not present

## 2022-07-12 DIAGNOSIS — M533 Sacrococcygeal disorders, not elsewhere classified: Secondary | ICD-10-CM | POA: Diagnosis not present

## 2022-07-12 DIAGNOSIS — I10 Essential (primary) hypertension: Secondary | ICD-10-CM | POA: Diagnosis not present

## 2022-07-12 NOTE — Patient Instructions (Signed)
Urinary Tract Infection, Adult  A urinary tract infection (UTI) is an infection of any part of the urinary tract. The urinary tract includes the kidneys, ureters, bladder, and urethra. These organs make, store, and get rid of urine in the body. An upper UTI affects the ureters and kidneys. A lower UTI affects the bladder and urethra. What are the causes? Most urinary tract infections are caused by bacteria in your genital area around your urethra, where urine leaves your body. These bacteria grow and cause inflammation of your urinary tract. What increases the risk? You are more likely to develop this condition if: You have a urinary catheter that stays in place. You are not able to control when you urinate or have a bowel movement (incontinence). You are female and you: Use a spermicide or diaphragm for birth control. Have low estrogen levels. Are pregnant. You have certain genes that increase your risk. You are sexually active. You take antibiotic medicines. You have a condition that causes your flow of urine to slow down, such as: An enlarged prostate, if you are female. Blockage in your urethra. A kidney stone. A nerve condition that affects your bladder control (neurogenic bladder). Not getting enough to drink, or not urinating often. You have certain medical conditions, such as: Diabetes. A weak disease-fighting system (immunesystem). Sickle cell disease. Gout. Spinal cord injury. What are the signs or symptoms? Symptoms of this condition include: Needing to urinate right away (urgency). Frequent urination. This may include small amounts of urine each time you urinate. Pain or burning with urination. Blood in the urine. Urine that smells bad or unusual. Trouble urinating. Cloudy urine. Vaginal discharge, if you are female. Pain in the abdomen or the lower back. You may also have: Vomiting or a decreased appetite. Confusion. Irritability or tiredness. A fever or  chills. Diarrhea. The first symptom in older adults may be confusion. In some cases, they may not have any symptoms until the infection has worsened. How is this diagnosed? This condition is diagnosed based on your medical history and a physical exam. You may also have other tests, including: Urine tests. Blood tests. Tests for STIs (sexually transmitted infections). If you have had more than one UTI, a cystoscopy or imaging studies may be done to determine the cause of the infections. How is this treated? Treatment for this condition includes: Antibiotic medicine. Over-the-counter medicines to treat discomfort. Drinking enough water to stay hydrated. If you have frequent infections or have other conditions such as a kidney stone, you may need to see a health care provider who specializes in the urinary tract (urologist). In rare cases, urinary tract infections can cause sepsis. Sepsis is a life-threatening condition that occurs when the body responds to an infection. Sepsis is treated in the hospital with IV antibiotics, fluids, and other medicines. Follow these instructions at home:  Medicines Take over-the-counter and prescription medicines only as told by your health care provider. If you were prescribed an antibiotic medicine, take it as told by your health care provider. Do not stop using the antibiotic even if you start to feel better. General instructions Make sure you: Empty your bladder often and completely. Do not hold urine for long periods of time. Empty your bladder after sex. Wipe from front to back after urinating or having a bowel movement if you are female. Use each tissue only one time when you wipe. Drink enough fluid to keep your urine pale yellow. Keep all follow-up visits. This is important. Contact a health   care provider if: Your symptoms do not get better after 1-2 days. Your symptoms go away and then return. Get help right away if: You have severe pain in  your back or your lower abdomen. You have a fever or chills. You have nausea or vomiting. Summary A urinary tract infection (UTI) is an infection of any part of the urinary tract, which includes the kidneys, ureters, bladder, and urethra. Most urinary tract infections are caused by bacteria in your genital area. Treatment for this condition often includes antibiotic medicines. If you were prescribed an antibiotic medicine, take it as told by your health care provider. Do not stop using the antibiotic even if you start to feel better. Keep all follow-up visits. This is important. This information is not intended to replace advice given to you by your health care provider. Make sure you discuss any questions you have with your health care provider. Document Revised: 09/08/2019 Document Reviewed: 09/13/2019 Elsevier Patient Education  2024 [employer] Inc.  

## 2022-07-21 ENCOUNTER — Telehealth: Payer: Self-pay

## 2022-07-21 NOTE — Telephone Encounter (Signed)
Patient called requesting someone contact her regarding the antibiotic that was prescribed to her. E had some concerns with the dosage and wanted to ensure that was not too high of a dose to be taking once daily.   Medication: amoxicillin-clavulanate (AUGMENTIN) 500-125 MG tablet

## 2022-07-21 NOTE — Telephone Encounter (Signed)
Returned call to patient and confirmed by MD's office note that Augmentin 500 mg is the correct dose to be taking as a prophylactic.

## 2022-08-01 ENCOUNTER — Other Ambulatory Visit: Payer: Self-pay

## 2022-08-01 DIAGNOSIS — D0512 Intraductal carcinoma in situ of left breast: Secondary | ICD-10-CM

## 2022-08-01 NOTE — Progress Notes (Signed)
[facility] [address] [place] [address]    Clinic Day:  08/02/2022  Referring physician: Ignatius Specking, MD  Patient Care Team: Ignatius Specking, MD as PCP - General (Internal Medicine) Sherry Massed, MD as Medical Oncologist (Medical Oncology) Therese Sarah, RN as Oncology Nurse Navigator (Medical Oncology)   ASSESSMENT & PLAN:   Assessment: 1.  High-grade DCIS of the left breast: - Bilateral diagnostic mammogram (10/27/2021): 0.7 cm group of calcifications in the upper central left breast. - Left breast core biopsy 12:00 (11/10/2021): High-grade DCIS, solid type with necrosis.  Negative for invasive carcinoma.  ER 40%, PR 5% strong staining. - Left breast lumpectomy (12/17/2021): Grade 3 DCIS with central comedonecrosis and microcalcifications, 1.4 cm.  Margins negative.  Distance from DCIS to closest margin 4 mm.  pTis.  ER 40%, PR 5% - Anastrozole started on 01/18/2022.  Anastrozole discontinued due to joint pains.  Tamoxifen started on 05/31/2022. - She met with Dr. Langston Masker of radiation oncology.  As her prognosis was excellent, she was recommended against radiation.   2.  Social/family history: - She lives at home by herself.  She is independent of ADLs and IADLs.  She is accompanied by her daughter today.  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  She is a never smoker. - Sister had breast cancer in her late 24s.  Paternal aunt died of breast cancer.    Plan: 1.  High-grade DCIS of the left breast: - We have switched her to tamoxifen on 05/31/2022 due to intolerable joint pains from anastrozole. - She reports that she is tolerating tamoxifen very well. - Reports having frequent UTIs and was recommended to use vaginal estrogen cream by her gynecologist.  I think it is okay to use it. - Will schedule for mammogram in September.  RTC 6 months for follow-up.   2.  Osteoporosis (DEXA 04/18/2022  T-3.8): - She was evaluated by her dentist and was cleared for Prolia per patient. - We reviewed labs today which showed normal creatinine and calcium of 8.8 with albumin 3.8.  She is already taking calcium plus D5 100 mg twice daily. - She will proceed with her first injection of Prolia today.  We discussed side effects. - She will come back in 6 months.    Orders Placed This Encounter  Procedures   MM DIAG BREAST TOMO BILATERAL    Standing Status:   Future    Standing Expiration Date:   08/02/2023    Order Specific Question:   Reason for Exam (SYMPTOM  OR DIAGNOSIS REQUIRED)    Answer:   personal hx of breast cancer    Order Specific Question:   Preferred imaging location?    Answer:   [facility]    Order Specific Question:   Release to patient    Answer:   Immediate   CBC with Differential/Platelet    Standing Status:   Future    Standing Expiration Date:   08/02/2023    Order Specific Question:   Release to patient    Answer:   Immediate   Comprehensive metabolic panel    Standing Status:   Future    Standing Expiration Date:   08/02/2023    Order Specific Question:   Release to patient    Answer:   Immediate      I,Katie Daubenspeck,acting as a scribe for Sherry Massed, MD.,have documented all relevant  documentation on the behalf of Sherry Massed, MD,as directed by  Sherry Massed, MD while in the presence of Sherry Massed, MD.   I, Sherry Massed MD, have reviewed the above documentation for accuracy and completeness, and I agree with the above.   Sherry Massed, MD   6/18/20244:18 PM  CHIEF COMPLAINT:   Diagnosis: DCIS of the left breast    Cancer Staging  No matching staging information was found for the patient.   Prior Therapy: left breast lumpectomy 12/17/2021   Current Therapy:  Anastrozole    HISTORY OF PRESENT ILLNESS:   Oncology History  Ductal carcinoma in situ (DCIS) of left breast  11/24/2021 Initial  Diagnosis   Ductal carcinoma in situ (DCIS) of left breast    Genetic Testing   Negative genetic testing. No pathogenic variants identified on the [facility] Common Hereditary Cancers+RNA panel. The report date is 03/06/2022.  The Common Hereditary Cancers Panel + RNA offered by [facility] includes sequencing and/or deletion duplication testing of the following 48 genes: APC*, ATM*, AXIN2, BAP1, BARD1, BMPR1A, BRCA1, BRCA2, BRIP1, CDH1, CDK4, CDKN2A (p14ARF), CDKN2A (p16INK4a), CHEK2, CTNNA1, DICER1*, EPCAM*, FH*, GREM1*, HOXB13, KIT, MBD4, MEN1*, MLH1*, MSH2*, MSH3*, MSH6*, MUTYH, NF1*, NTHL1, PALB2, PDGFRA, PMS2*, POLD1*, POLE, PTEN*, RAD51C, RAD51D, SDHA*, SDHB, SDHC*, SDHD, SMAD4, SMARCA4, STK11, TP53, TSC1*, TSC2, VHL.       INTERVAL HISTORY:   Kayson is a 82 y.o. female presenting to clinic today for follow up of DCIS of the left breast. She was last seen by me on 04/26/22.  Today, she states that she is doing well overall. Her appetite level is at 90%. Her energy level is at 40%.  PAST MEDICAL HISTORY:   Past Medical History: Past Medical History:  Diagnosis Date   Arthritis    Atrial fibrillation (HCC)    Chronic headaches 11/29/2016   Dysrhythmia     Surgical History: Past Surgical History:  Procedure Laterality Date   CATARACT EXTRACTION, BILATERAL     Ruptured ovary     left ovary in the 60s    Social History: Social History   Socioeconomic History   Marital status: Widowed    Spouse name: Not on file   Number of children: Not on file   Years of education: Not on file   Highest education level: Not on file  Occupational History   Not on file  Tobacco Use   Smoking status: Never   Smokeless tobacco: Never  Substance and Sexual Activity   Alcohol use: No   Drug use: No   Sexual activity: Not on file  Other Topics Concern   Not on file  Social History Narrative   Not on file   Social Determinants of Health   Financial Resource Strain: Not on file  Food  Insecurity: No Food Insecurity (01/18/2022)   Hunger Vital Sign    Worried About Running Out of Food in the Last Year: Never true    Ran Out of Food in the Last Year: Never true  Transportation Needs: No Transportation Needs (01/18/2022)   PRAPARE - Administrator, Civil Service (Medical): No    Lack of Transportation (Non-Medical): No  Physical Activity: Not on file  Stress: Not on file  Social Connections: Not on file  Intimate Partner Violence: Not At Risk (01/18/2022)   Humiliation, Afraid, Rape, and Kick questionnaire    Fear of Current or Ex-Partner: No    Emotionally Abused: No    Physically Abused: No  Sexually Abused: No    Family History: Family History  Problem Relation Age of Onset   Breast cancer Sister 96 - 63   Colon cancer Maternal Aunt 96 - 59   Brain cancer Maternal Uncle 13 - 80   Breast cancer Paternal Aunt 53 - 45       recurrence, d. 95s    Current Medications:  Current Outpatient Medications:    acetaminophen (TYLENOL) 325 MG tablet, Take 650 mg by mouth every 6 (six) hours as needed for moderate pain., Disp: , Rfl:    amoxicillin-clavulanate (AUGMENTIN) 500-125 MG tablet, Take 1 tablet by mouth at bedtime., Disp: 30 tablet, Rfl: 5   Calcium Carb-Cholecalciferol (CALCIUM + VITAMIN D3 PO), Take 1 tablet by mouth daily., Disp: , Rfl:    ELIQUIS 5 MG TABS tablet, Take 5 mg by mouth 2 (two) times daily., Disp: , Rfl:    latanoprost (XALATAN) 0.005 % ophthalmic solution, Place 1 drop into both eyes at bedtime., Disp: , Rfl:    metoprolol tartrate (LOPRESSOR) 25 MG tablet, Take 12.5 mg by mouth 2 (two) times daily., Disp: , Rfl:    pantoprazole (PROTONIX) 40 MG tablet, Take 40 mg by mouth daily., Disp: , Rfl:    tamoxifen (NOLVADEX) 20 MG tablet, Take 1 tablet (20 mg total) by mouth daily., Disp: 30 tablet, Rfl: 3   Allergies: Allergies  Allergen Reactions   Macrodantin [Nitrofurantoin] Rash   Sulfa Antibiotics     Headaches.   Tetracyclines &  Related     Yeast infection    REVIEW OF SYSTEMS:   Review of Systems  Constitutional:  Negative for chills, fatigue and fever.  HENT:   Negative for lump/mass, mouth sores, nosebleeds, sore throat and trouble swallowing.   Eyes:  Negative for eye problems.  Respiratory:  Positive for shortness of breath. Negative for cough.   Cardiovascular:  Negative for chest pain, leg swelling and palpitations.  Gastrointestinal:  Negative for abdominal pain, constipation, diarrhea, nausea and vomiting.  Genitourinary:  Negative for bladder incontinence, difficulty urinating, dysuria, frequency, hematuria and nocturia.   Musculoskeletal:  Negative for arthralgias, back pain, flank pain, myalgias and neck pain.  Skin:  Negative for itching and rash.  Neurological:  Positive for numbness. Negative for dizziness and headaches.  Hematological:  Does not bruise/bleed easily.  Psychiatric/Behavioral:  Negative for depression, sleep disturbance and suicidal ideas. The patient is not nervous/anxious.   All other systems reviewed and are negative.    VITALS:   Blood pressure 138/76, pulse 72, temperature 98 F (36.7 C), temperature source Oral, resp. rate 18, weight 168 lb 14.4 oz (76.6 kg), SpO2 96 %.  Wt Readings from Last 3 Encounters:  08/02/22 168 lb 14.4 oz (76.6 kg)  04/26/22 167 lb 8 oz (76 kg)  01/18/22 168 lb 11.2 oz (76.5 kg)    Body mass index is 30.89 kg/m.  Performance status (ECOG): 1 - Symptomatic but completely ambulatory  PHYSICAL EXAM:   Physical Exam Vitals and nursing note reviewed. Exam conducted with a chaperone present.  Constitutional:      Appearance: Normal appearance.  Cardiovascular:     Rate and Rhythm: Normal rate and regular rhythm.     Pulses: Normal pulses.     Heart sounds: Normal heart sounds.  Pulmonary:     Effort: Pulmonary effort is normal.     Breath sounds: Normal breath sounds.  Abdominal:     Palpations: Abdomen is soft. There is no  hepatomegaly, splenomegaly or  mass.     Tenderness: There is no abdominal tenderness.  Musculoskeletal:     Right lower leg: No edema.     Left lower leg: No edema.  Lymphadenopathy:     Cervical: No cervical adenopathy.     Right cervical: No superficial, deep or posterior cervical adenopathy.    Left cervical: No superficial, deep or posterior cervical adenopathy.     Upper Body:     Right upper body: No supraclavicular or axillary adenopathy.     Left upper body: No supraclavicular or axillary adenopathy.  Neurological:     General: No focal deficit present.     Mental Status: She is alert and oriented to person, place, and time.  Psychiatric:        Mood and Affect: Mood normal.        Behavior: Behavior normal.     LABS:      Latest Ref Rng & Units 04/18/2022    1:31 PM  CBC  WBC 4.0 - 10.5 K/uL 4.7   Hemoglobin 12.0 - 15.0 g/dL 16.1   Hematocrit 09.6 - 46.0 % 37.9   Platelets 150 - 400 K/uL 262       Latest Ref Rng & Units 08/02/2022    2:22 PM 04/18/2022    1:31 PM  CMP  Glucose 70 - 99 mg/dL 045  409   BUN 8 - 23 mg/dL 15  18   Creatinine 8.11 - 1.00 mg/dL 9.14  7.82   Sodium 956 - 145 mmol/L 135  136   Potassium 3.5 - 5.1 mmol/L 4.1  4.0   Chloride 98 - 111 mmol/L 102  103   CO2 22 - 32 mmol/L 21  25   Calcium 8.9 - 10.3 mg/dL 8.8  9.1   Total Protein 6.5 - 8.1 g/dL 7.3  7.5   Total Bilirubin 0.3 - 1.2 mg/dL 0.4  0.5   Alkaline Phos 38 - 126 U/L 44  59   AST 15 - 41 U/L 23  21   ALT 0 - 44 U/L 16  14      No results found for: "CEA1", "CEA" / No results found for: "CEA1", "CEA" No results found for: "PSA1" No results found for: "OZH086" No results found for: "CAN125"  No results found for: "TOTALPROTELP", "ALBUMINELP", "A1GS", "A2GS", "BETS", "BETA2SER", "GAMS", "MSPIKE", "SPEI" No results found for: "TIBC", "FERRITIN", "IRONPCTSAT" No results found for: "LDH"   STUDIES:   No results found.

## 2022-08-02 ENCOUNTER — Inpatient Hospital Stay: Payer: Medicare Other | Attending: Hematology | Admitting: Hematology

## 2022-08-02 ENCOUNTER — Inpatient Hospital Stay: Payer: Medicare Other

## 2022-08-02 ENCOUNTER — Other Ambulatory Visit (HOSPITAL_COMMUNITY): Payer: Self-pay | Admitting: Hematology

## 2022-08-02 VITALS — BP 138/76 | HR 72 | Temp 98.0°F | Resp 18 | Wt 168.9 lb

## 2022-08-02 DIAGNOSIS — M810 Age-related osteoporosis without current pathological fracture: Secondary | ICD-10-CM | POA: Insufficient documentation

## 2022-08-02 DIAGNOSIS — D0512 Intraductal carcinoma in situ of left breast: Secondary | ICD-10-CM | POA: Diagnosis not present

## 2022-08-02 DIAGNOSIS — N390 Urinary tract infection, site not specified: Secondary | ICD-10-CM | POA: Diagnosis not present

## 2022-08-02 DIAGNOSIS — Z853 Personal history of malignant neoplasm of breast: Secondary | ICD-10-CM

## 2022-08-02 DIAGNOSIS — Z79810 Long term (current) use of selective estrogen receptor modulators (SERMs): Secondary | ICD-10-CM | POA: Diagnosis not present

## 2022-08-02 DIAGNOSIS — Z79899 Other long term (current) drug therapy: Secondary | ICD-10-CM | POA: Insufficient documentation

## 2022-08-02 DIAGNOSIS — N8110 Cystocele, unspecified: Secondary | ICD-10-CM | POA: Diagnosis not present

## 2022-08-02 DIAGNOSIS — Z803 Family history of malignant neoplasm of breast: Secondary | ICD-10-CM | POA: Insufficient documentation

## 2022-08-02 DIAGNOSIS — Z800 Family history of malignant neoplasm of digestive organs: Secondary | ICD-10-CM | POA: Insufficient documentation

## 2022-08-02 DIAGNOSIS — N958 Other specified menopausal and perimenopausal disorders: Secondary | ICD-10-CM | POA: Diagnosis not present

## 2022-08-02 LAB — COMPREHENSIVE METABOLIC PANEL
ALT: 16 U/L (ref 0–44)
AST: 23 U/L (ref 15–41)
Albumin: 3.8 g/dL (ref 3.5–5.0)
Alkaline Phosphatase: 44 U/L (ref 38–126)
Anion gap: 12 (ref 5–15)
BUN: 15 mg/dL (ref 8–23)
CO2: 21 mmol/L — ABNORMAL LOW (ref 22–32)
Calcium: 8.8 mg/dL — ABNORMAL LOW (ref 8.9–10.3)
Chloride: 102 mmol/L (ref 98–111)
Creatinine, Ser: 0.85 mg/dL (ref 0.44–1.00)
GFR, Estimated: >60 mL/min (ref >60)
Glucose, Bld: 111 mg/dL — ABNORMAL HIGH (ref 70–99)
Potassium: 4.1 mmol/L (ref 3.5–5.1)
Sodium: 135 mmol/L (ref 135–145)
Total Bilirubin: 0.4 mg/dL (ref 0.3–1.2)
Total Protein: 7.3 g/dL (ref 6.5–8.1)

## 2022-08-02 MED ORDER — DENOSUMAB 60 MG/ML ~~LOC~~ SOSY
60.0000 mg | PREFILLED_SYRINGE | Freq: Once | SUBCUTANEOUS | Status: AC
  Administered 2022-08-02: 60 mg via SUBCUTANEOUS
  Filled 2022-08-02: qty 1

## 2022-08-02 NOTE — Patient Instructions (Signed)
[facility]  Discharge Instructions: Thank you for choosing [facility] to provide your oncology and hematology care.  If you have a lab appointment with the Cancer Center - please note that after April 8th, 2024, all labs will be drawn in the cancer center.  You do not have to check in or register with the main entrance as you have in the past but will complete your check-in in the cancer center.  Wear comfortable clothing and clothing appropriate for easy access to any Portacath or PICC line.   We strive to give you quality time with your provider. You may need to reschedule your appointment if you arrive late (15 or more minutes).  Arriving late affects you and other patients whose appointments are after yours.  Also, if you miss three or more appointments without notifying the office, you may be dismissed from the clinic at the provider's discretion.      For prescription refill requests, have your pharmacy contact our office and allow 72 hours for refills to be completed.    Today you received the following chemotherapy and/or immunotherapy agents Prolia.  Denosumab Injection (Osteoporosis) What is this medication? DENOSUMAB (den oh SUE mab) prevents and treats osteoporosis. It works by Interior and spatial designer stronger and less likely to break (fracture). It is a monoclonal antibody. This medicine may be used for other purposes; ask your health care provider or pharmacist if you have questions. COMMON BRAND NAME(S): Prolia What should I tell my care team before I take this medication? They need to know if you have any of these conditions: Dental or gum disease Had thyroid or parathyroid (glands located in neck) surgery Having dental surgery or a tooth pulled Kidney disease Low levels of calcium in the blood On dialysis Poor nutrition Thyroid disease Trouble absorbing nutrients from your food An unusual or allergic reaction to denosumab, other  medications, foods, dyes, or preservatives Pregnant or trying to get pregnant Breastfeeding How should I use this medication? This medication is injected under the skin. It is given by your care team in a hospital or clinic setting. A special MedGuide will be given to you before each treatment. Be sure to read this information carefully each time. Talk to your care team about the use of this medication in children. Special care may be needed. Overdosage: If you think you have taken too much of this medicine contact a poison control center or emergency room at once. NOTE: This medicine is only for you. Do not share this medicine with others. What if I miss a dose? Keep appointments for follow-up doses. It is important not to miss your dose. Call your care team if you are unable to keep an appointment. What may interact with this medication? Do not take this medication with any of the following: Other medications that contain denosumab This medication may also interact with the following: Medications that lower your chance of fighting infection Steroid medications, such as prednisone or cortisone This list may not describe all possible interactions. Give your health care provider a list of all the medicines, herbs, non-prescription drugs, or dietary supplements you use. Also tell them if you smoke, drink alcohol, or use illegal drugs. Some items may interact with your medicine. What should I watch for while using this medication? Your condition will be monitored carefully while you are receiving this medication. You may need blood work done while taking this medication. This medication may increase your risk of getting  an infection. Call your care team for advice if you get a fever, chills, sore throat, or other symptoms of a cold or flu. Do not treat yourself. Try to avoid being around people who are sick. Tell your dentist and dental surgeon that you are taking this medication. You should not  have major dental surgery while on this medication. See your dentist to have a dental exam and fix any dental problems before starting this medication. Take good care of your teeth while on this medication. Make sure you see your dentist for regular follow-up appointments. This medication may cause low levels of calcium in your body. The risk of severe side effects is increased in people with kidney disease. Your care team may prescribe calcium and vitamin D to help prevent low calcium levels while you take this medication. It is important to take calcium and vitamin D as directed by your care team. Talk to your care team if you may be pregnant. Serious birth defects may occur if you take this medication during pregnancy and for 5 months after the last dose. You will need a negative pregnancy test before starting this medication. Contraception is recommended while taking this medication and for 5 months after the last dose. Your care team can help you find the option that works for you. Talk to your care team before breastfeeding. Changes to your treatment plan may be needed. What side effects may I notice from receiving this medication? Side effects that you should report to your care team as soon as possible: Allergic reactions--skin rash, itching, hives, swelling of the face, lips, tongue, or throat Infection--fever, chills, cough, sore throat, wounds that don't heal, pain or trouble when passing urine, general feeling of discomfort or being unwell Low calcium level--muscle pain or cramps, confusion, tingling, or numbness in the hands or feet Osteonecrosis of the jaw--pain, swelling, or redness in the mouth, numbness of the jaw, poor healing after dental work, unusual discharge from the mouth, visible bones in the mouth Severe bone, joint, or muscle pain Skin infection--skin redness, swelling, warmth, or pain Side effects that usually do not require medical attention (report these to your care team if  they continue or are bothersome): Back pain Headache Joint pain Muscle pain Pain in the hands, arms, legs, or feet Runny or stuffy nose Sore throat This list may not describe all possible side effects. Call your doctor for medical advice about side effects. You may report side effects to FDA at 1-800-FDA-1088. Where should I keep my medication? This medication is given in a hospital or clinic. It will not be stored at home. NOTE: This sheet is a summary. It may not cover all possible information. If you have questions about this medicine, talk to your doctor, pharmacist, or health care provider.  2024 Elsevier/Gold Standard (2022-03-08 00:00:00)        To help prevent nausea and vomiting after your treatment, we encourage you to take your nausea medication as directed.  BELOW ARE SYMPTOMS THAT SHOULD BE REPORTED IMMEDIATELY: *FEVER GREATER THAN 100.4 F (38 C) OR HIGHER *CHILLS OR SWEATING *NAUSEA AND VOMITING THAT IS NOT CONTROLLED WITH YOUR NAUSEA MEDICATION *UNUSUAL SHORTNESS OF BREATH *UNUSUAL BRUISING OR BLEEDING *URINARY PROBLEMS (pain or burning when urinating, or frequent urination) *BOWEL PROBLEMS (unusual diarrhea, constipation, pain near the anus) TENDERNESS IN MOUTH AND THROAT WITH OR WITHOUT PRESENCE OF ULCERS (sore throat, sores in mouth, or a toothache) UNUSUAL RASH, SWELLING OR PAIN  UNUSUAL VAGINAL DISCHARGE OR ITCHING  Items with * indicate a potential emergency and should be followed up as soon as possible or go to the Emergency Department if any problems should occur.  Please show the CHEMOTHERAPY ALERT CARD or IMMUNOTHERAPY ALERT CARD at check-in to the Emergency Department and triage nurse.  Should you have questions after your visit or need to cancel or reschedule your appointment, please contact [facility] [phone]  and follow the prompts.  Office hours are 8:00 a.m. to 4:30 p.m. Monday - Friday. Please note that voicemails left  after 4:00 p.m. may not be returned until the following business day.  We are closed weekends and major holidays. You have access to a nurse at all times for urgent questions. Please call the main number to the clinic [phone] and follow the prompts.  For any non-urgent questions, you may also contact your provider using MyChart. We now offer e-Visits for anyone 50 and older to request care online for non-urgent symptoms. For details visit [email or web address].   Also download the MyChart app! Go to the app store, search "MyChart", open the app, select [place], and log in with your MyChart username and password.

## 2022-08-02 NOTE — Patient Instructions (Addendum)
[facility]  Discharge Instructions  You were seen and examined today by Dr. Ellin Saba.  Dr. Ellin Saba discussed your most recent lab work which revealed that everything looks good.  Dr. Ellin Saba wants you to keep getting your prolia injections. He will repeat mammogram in September 2024.  Follow-up as scheduled in 6 months.    Thank you for choosing [facility] to provide your oncology and hematology care.   To afford each patient quality time with our provider, please arrive at least 15 minutes before your scheduled appointment time. You may need to reschedule your appointment if you arrive late (10 or more minutes). Arriving late affects you and other patients whose appointments are after yours.  Also, if you miss three or more appointments without notifying the office, you may be dismissed from the clinic at the provider's discretion.    Again, thank you for choosing [facility].  Our hope is that these requests will decrease the amount of time that you wait before being seen by our physicians.   If you have a lab appointment with the Cancer Center - please note that after April 8th, all labs will be drawn in the cancer center.  You do not have to check in or register with the main entrance as you have in the past but will complete your check-in at the cancer center.            _____________________________________________________________  Should you have questions after your visit to [facility], please contact our office at [phone] and follow the prompts.  Our office hours are 8:00 a.m. to 4:30 p.m. Monday - Thursday and 8:00 a.m. to 2:30 p.m. Friday.  Please note that voicemails left after 4:00 p.m. may not be returned until the following business day.  We are closed weekends and all major holidays.  You do have access to a nurse 24-7, just call the main number to the clinic [phone] and do  not press any options, hold on the line and a nurse will answer the phone.    For prescription refill requests, have your pharmacy contact our office and allow 72 hours.    Masks are no longer required in the cancer centers. If you would like for your care team to wear a mask while they are taking care of you, please let them know. You may have one support person who is at least 83 years old accompany you for your appointments.

## 2022-08-02 NOTE — Progress Notes (Signed)
Patient tolerated Prolia injection with no complaints voiced.  Patient confirmed she is taking calcium and vitamin D supplements daily.  Site clean and dry with no bruising or swelling noted.  No complaints of pain.  Discharged with vital signs stable and no signs or symptoms of distress noted.

## 2022-08-04 DIAGNOSIS — H401431 Capsular glaucoma with pseudoexfoliation of lens, bilateral, mild stage: Secondary | ICD-10-CM | POA: Diagnosis not present

## 2022-08-25 DIAGNOSIS — Z0000 Encounter for general adult medical examination without abnormal findings: Secondary | ICD-10-CM | POA: Diagnosis not present

## 2022-08-25 DIAGNOSIS — Z299 Encounter for prophylactic measures, unspecified: Secondary | ICD-10-CM | POA: Diagnosis not present

## 2022-08-25 DIAGNOSIS — Z7189 Other specified counseling: Secondary | ICD-10-CM | POA: Diagnosis not present

## 2022-08-25 DIAGNOSIS — R5383 Other fatigue: Secondary | ICD-10-CM | POA: Diagnosis not present

## 2022-08-25 DIAGNOSIS — E7800 Pure hypercholesterolemia, unspecified: Secondary | ICD-10-CM | POA: Diagnosis not present

## 2022-08-25 DIAGNOSIS — Z1331 Encounter for screening for depression: Secondary | ICD-10-CM | POA: Diagnosis not present

## 2022-08-25 DIAGNOSIS — I700 Atherosclerosis of aorta: Secondary | ICD-10-CM | POA: Diagnosis not present

## 2022-08-25 DIAGNOSIS — I10 Essential (primary) hypertension: Secondary | ICD-10-CM | POA: Diagnosis not present

## 2022-08-25 DIAGNOSIS — Z79899 Other long term (current) drug therapy: Secondary | ICD-10-CM | POA: Diagnosis not present

## 2022-08-25 DIAGNOSIS — E559 Vitamin D deficiency, unspecified: Secondary | ICD-10-CM | POA: Diagnosis not present

## 2022-08-25 DIAGNOSIS — Z1339 Encounter for screening examination for other mental health and behavioral disorders: Secondary | ICD-10-CM | POA: Diagnosis not present

## 2022-09-08 DIAGNOSIS — Z299 Encounter for prophylactic measures, unspecified: Secondary | ICD-10-CM | POA: Diagnosis not present

## 2022-09-08 DIAGNOSIS — M533 Sacrococcygeal disorders, not elsewhere classified: Secondary | ICD-10-CM | POA: Diagnosis not present

## 2022-09-08 DIAGNOSIS — I10 Essential (primary) hypertension: Secondary | ICD-10-CM | POA: Diagnosis not present

## 2022-09-19 ENCOUNTER — Other Ambulatory Visit: Payer: Self-pay | Admitting: *Deleted

## 2022-09-19 ENCOUNTER — Other Ambulatory Visit: Payer: Self-pay | Admitting: Hematology

## 2022-09-19 DIAGNOSIS — D0512 Intraductal carcinoma in situ of left breast: Secondary | ICD-10-CM

## 2022-09-19 NOTE — Telephone Encounter (Signed)
Tamoxifen refill approved.  Patient is tolerating and is to continue therapy. 

## 2022-10-10 ENCOUNTER — Ambulatory Visit (INDEPENDENT_AMBULATORY_CARE_PROVIDER_SITE_OTHER): Payer: Medicare Other | Admitting: Urology

## 2022-10-10 VITALS — BP 119/78 | HR 80

## 2022-10-10 DIAGNOSIS — Z87440 Personal history of urinary (tract) infections: Secondary | ICD-10-CM

## 2022-10-10 DIAGNOSIS — N390 Urinary tract infection, site not specified: Secondary | ICD-10-CM | POA: Diagnosis not present

## 2022-10-10 DIAGNOSIS — Z09 Encounter for follow-up examination after completed treatment for conditions other than malignant neoplasm: Secondary | ICD-10-CM | POA: Diagnosis not present

## 2022-10-10 LAB — URINALYSIS, ROUTINE W REFLEX MICROSCOPIC
Bilirubin, UA: NEGATIVE
Glucose, UA: NEGATIVE
Ketones, UA: NEGATIVE
Leukocytes,UA: NEGATIVE
Nitrite, UA: NEGATIVE
Protein,UA: NEGATIVE
RBC, UA: NEGATIVE
Specific Gravity, UA: 1.01 (ref 1.005–1.030)
Urobilinogen, Ur: 0.2 mg/dL (ref 0.2–1.0)
pH, UA: 6 (ref 5.0–7.5)

## 2022-10-10 MED ORDER — AMOXICILLIN-POT CLAVULANATE 500-125 MG PO TABS: 1.0000 | ORAL_TABLET | Freq: Every day | ORAL | 3 refills | Status: DC

## 2022-10-10 NOTE — Progress Notes (Unsigned)
10/10/2022 1:54 PM   Sherry Smith 1940-10-04 [id number]  Referring provider: Ignatius Specking, MD [address] [place],  [place] [address]  Recurrent UTI   HPI: Sherry Smith is a 82yo here for followup for recurrent UTI. No UTIs since last visit. She is on augmentin 500mg  prophylaxids   PMH: Past Medical History:  Diagnosis Date   Arthritis    Atrial fibrillation [facility])    Chronic headaches 11/29/2016   Dysrhythmia     Surgical History: Past Surgical History:  Procedure Laterality Date   CATARACT EXTRACTION, BILATERAL     Ruptured ovary     left ovary in the 60s    Home Medications:  Allergies as of 10/10/2022       Reactions   Macrodantin [nitrofurantoin] Rash   Sulfa Antibiotics    Headaches.   Tetracyclines & Related    Yeast infection        Medication List        Accurate as of October 10, 2022  1:54 PM. If you have any questions, ask your nurse or doctor.          acetaminophen 325 MG tablet Commonly known as: TYLENOL Take 650 mg by mouth every 6 (six) hours as needed for moderate pain.   amoxicillin-clavulanate 500-125 MG tablet Commonly known as: Augmentin Take 1 tablet by mouth at bedtime.   CALCIUM + VITAMIN D3 PO Take 1 tablet by mouth daily.   Eliquis 5 MG Tabs tablet Generic drug: apixaban Take 5 mg by mouth 2 (two) times daily.   estradiol 0.1 MG/GM vaginal cream Commonly known as: ESTRACE Apply "green pea" sized amount to opening of vagina 3 times a week.   latanoprost 0.005 % ophthalmic solution Commonly known as: XALATAN Place 1 drop into both eyes at bedtime.   metoprolol tartrate 25 MG tablet Commonly known as: LOPRESSOR Take 12.5 mg by mouth 2 (two) times daily.   pantoprazole 40 MG tablet Commonly known as: PROTONIX Take 40 mg by mouth daily.   rosuvastatin 5 MG tablet Commonly known as: CRESTOR Take 5 mg by mouth once a week.   tamoxifen 20 MG tablet Commonly known as: NOLVADEX TAKE 1 TABLET BY MOUTH DAILY         Allergies:  Allergies  Allergen Reactions   Macrodantin [Nitrofurantoin] Rash   Sulfa Antibiotics     Headaches.   Tetracyclines & Related     Yeast infection    Family History: Family History  Problem Relation Age of Onset   Breast cancer Sister 61 - 56   Colon cancer Maternal Aunt 67 - 51   Brain cancer Maternal Uncle 24 - 80   Breast cancer Paternal Aunt 7 - 45       recurrence, d. 69s    Social History:  reports that she has never smoked. She has never used smokeless tobacco. She reports that she does not drink alcohol and does not use drugs.  ROS: All other review of systems were reviewed and are negative except what is noted above in HPI  Physical Exam: BP 119/78   Pulse 80   Constitutional:  Alert and oriented, No acute distress. HEENT: [place] AT, moist mucus membranes.  Trachea midline, no masses. Cardiovascular: No clubbing, cyanosis, or edema. Respiratory: Normal respiratory effort, no increased work of breathing. GI: Abdomen is soft, nontender, nondistended, no abdominal masses GU: No CVA tenderness.  Lymph: No cervical or inguinal lymphadenopathy. Skin: No rashes, bruises or suspicious lesions. Neurologic:  Grossly intact, no focal deficits, moving all 4 extremities. Psychiatric: Normal mood and affect.  Laboratory Data: Lab Results  Component Value Date   WBC 4.7 04/18/2022   HGB 12.4 04/18/2022   HCT 37.9 04/18/2022   MCV 97.4 04/18/2022   PLT 262 04/18/2022    Lab Results  Component Value Date   CREATININE 0.85 08/02/2022    No results found for: "PSA"  No results found for: "TESTOSTERONE"  No results found for: "HGBA1C"  Urinalysis    Component Value Date/Time   APPEARANCEUR Clear 07/08/2022 1119   GLUCOSEU Negative 07/08/2022 1119   BILIRUBINUR Negative 07/08/2022 1119   PROTEINUR Negative 07/08/2022 1119   NITRITE Negative 07/08/2022 1119   LEUKOCYTESUR 2+ (A) 07/08/2022 1119    Lab Results  Component Value Date   LABMICR  See below: 07/08/2022   WBCUA >30 (A) 07/08/2022   LABEPIT 0-10 07/08/2022   MUCUS Present 09/09/2021   BACTERIA Many (A) 07/08/2022    Pertinent Imaging: *** No results found for this or any previous visit.  No results found for this or any previous visit.  No results found for this or any previous visit.  No results found for this or any previous visit.  No results found for this or any previous visit.  No valid procedures specified. No results found for this or any previous visit.  Results for orders placed in visit on 09/09/21  CT RENAL STONE STUDY  Narrative CLINICAL DATA:  Lower abdominal and flank pain for couple months, bladder pain, acute cystitis with hematuria, recurrent UTIs, suspected kidney stone  EXAM: CT ABDOMEN AND PELVIS WITHOUT CONTRAST  TECHNIQUE: Multidetector CT imaging of the abdomen and pelvis was performed following the standard protocol without IV contrast.  RADIATION DOSE REDUCTION: This exam was performed according to the departmental dose-optimization program which includes automated exposure control, adjustment of the mA and/or kV according to patient size and/or use of iterative reconstruction technique.  COMPARISON:  None  FINDINGS: Lower chest: Elevation of LEFT diaphragm with LEFT basilar atelectasis  Hepatobiliary: Large calcified gallstone in gallbladder 2.1 cm diameter. Gallbladder and liver otherwise normal appearance  Pancreas: Atrophic pancreas without mass  Spleen: Normal appearance  Adrenals/Urinary Tract: Adrenal glands normal appearance. Tiny peripelvic cysts LEFT kidney; no follow-up imaging recommended. Kidneys, ureters, and bladder otherwise normal appearance.  Stomach/Bowel: Appendix not visualized. No pericecal inflammatory process seen. Stomach decompressed. Large and small bowel loops unremarkable.  Vascular/Lymphatic: Atherosclerotic calcifications aorta and iliac arteries without aneurysm. No  adenopathy.  Reproductive: Atrophic uterus and ovaries  Other: No free air or free fluid. LEFT inguinal hernia containing fat. Small umbilical hernia containing fat. No definite inflammatory process.  Musculoskeletal: Osseous demineralization. Rotatory scoliosis of the thoracolumbar spine. Scattered degenerative disc and facet disease changes thoracolumbar spine.  IMPRESSION: Cholelithiasis without evidence of acute cholecystitis.  LEFT inguinal and umbilical hernias containing fat.  No acute intra-abdominal or intrapelvic abnormalities.  Aortic Atherosclerosis (ICD10-I70.0).   Electronically Signed By: Ulyses Southward M.D. On: 09/15/2021 17:05   Assessment & Plan:    1. Recurrent UTI *** - Urinalysis, Routine w reflex microscopic   No follow-ups on file.  Wilkie Aye, MD  [facility] [place]

## 2022-10-12 ENCOUNTER — Encounter: Payer: Self-pay | Admitting: Urology

## 2022-10-12 NOTE — Patient Instructions (Signed)
Urinary Tract Infection, Adult  A urinary tract infection (UTI) is an infection of any part of the urinary tract. The urinary tract includes the kidneys, ureters, bladder, and urethra. These organs make, store, and get rid of urine in the body. An upper UTI affects the ureters and kidneys. A lower UTI affects the bladder and urethra. What are the causes? Most urinary tract infections are caused by bacteria in your genital area around your urethra, where urine leaves your body. These bacteria grow and cause inflammation of your urinary tract. What increases the risk? You are more likely to develop this condition if: You have a urinary catheter that stays in place. You are not able to control when you urinate or have a bowel movement (incontinence). You are female and you: Use a spermicide or diaphragm for birth control. Have low estrogen levels. Are pregnant. You have certain genes that increase your risk. You are sexually active. You take antibiotic medicines. You have a condition that causes your flow of urine to slow down, such as: An enlarged prostate, if you are female. Blockage in your urethra. A kidney stone. A nerve condition that affects your bladder control (neurogenic bladder). Not getting enough to drink, or not urinating often. You have certain medical conditions, such as: Diabetes. A weak disease-fighting system (immunesystem). Sickle cell disease. Gout. Spinal cord injury. What are the signs or symptoms? Symptoms of this condition include: Needing to urinate right away (urgency). Frequent urination. This may include small amounts of urine each time you urinate. Pain or burning with urination. Blood in the urine. Urine that smells bad or unusual. Trouble urinating. Cloudy urine. Vaginal discharge, if you are female. Pain in the abdomen or the lower back. You may also have: Vomiting or a decreased appetite. Confusion. Irritability or tiredness. A fever or  chills. Diarrhea. The first symptom in older adults may be confusion. In some cases, they may not have any symptoms until the infection has worsened. How is this diagnosed? This condition is diagnosed based on your medical history and a physical exam. You may also have other tests, including: Urine tests. Blood tests. Tests for STIs (sexually transmitted infections). If you have had more than one UTI, a cystoscopy or imaging studies may be done to determine the cause of the infections. How is this treated? Treatment for this condition includes: Antibiotic medicine. Over-the-counter medicines to treat discomfort. Drinking enough water to stay hydrated. If you have frequent infections or have other conditions such as a kidney stone, you may need to see a health care provider who specializes in the urinary tract (urologist). In rare cases, urinary tract infections can cause sepsis. Sepsis is a life-threatening condition that occurs when the body responds to an infection. Sepsis is treated in the hospital with IV antibiotics, fluids, and other medicines. Follow these instructions at home:  Medicines Take over-the-counter and prescription medicines only as told by your health care provider. If you were prescribed an antibiotic medicine, take it as told by your health care provider. Do not stop using the antibiotic even if you start to feel better. General instructions Make sure you: Empty your bladder often and completely. Do not hold urine for long periods of time. Empty your bladder after sex. Wipe from front to back after urinating or having a bowel movement if you are female. Use each tissue only one time when you wipe. Drink enough fluid to keep your urine pale yellow. Keep all follow-up visits. This is important. Contact a health   care provider if: Your symptoms do not get better after 1-2 days. Your symptoms go away and then return. Get help right away if: You have severe pain in  your back or your lower abdomen. You have a fever or chills. You have nausea or vomiting. Summary A urinary tract infection (UTI) is an infection of any part of the urinary tract, which includes the kidneys, ureters, bladder, and urethra. Most urinary tract infections are caused by bacteria in your genital area. Treatment for this condition often includes antibiotic medicines. If you were prescribed an antibiotic medicine, take it as told by your health care provider. Do not stop using the antibiotic even if you start to feel better. Keep all follow-up visits. This is important. This information is not intended to replace advice given to you by your health care provider. Make sure you discuss any questions you have with your health care provider. Document Revised: 09/08/2019 Document Reviewed: 09/13/2019 Elsevier Patient Education  2024 [employer] Inc.  

## 2022-11-01 ENCOUNTER — Encounter (HOSPITAL_COMMUNITY): Payer: Self-pay

## 2022-11-01 ENCOUNTER — Ambulatory Visit (HOSPITAL_COMMUNITY)
Admission: RE | Admit: 2022-11-01 | Discharge: 2022-11-01 | Disposition: A | Discharge status: Home / Self Care | Payer: Medicare Other | Source: Ambulatory Visit | Attending: Hematology | Admitting: Hematology

## 2022-11-01 DIAGNOSIS — Z853 Personal history of malignant neoplasm of breast: Secondary | ICD-10-CM | POA: Insufficient documentation

## 2022-11-01 DIAGNOSIS — D0512 Intraductal carcinoma in situ of left breast: Secondary | ICD-10-CM | POA: Insufficient documentation

## 2022-11-01 DIAGNOSIS — R92333 Mammographic heterogeneous density, bilateral breasts: Secondary | ICD-10-CM | POA: Diagnosis not present

## 2022-11-01 DIAGNOSIS — R928 Other abnormal and inconclusive findings on diagnostic imaging of breast: Secondary | ICD-10-CM | POA: Diagnosis not present

## 2022-11-01 DIAGNOSIS — Z86000 Personal history of in-situ neoplasm of breast: Secondary | ICD-10-CM | POA: Diagnosis not present

## 2022-11-30 DIAGNOSIS — H401432 Capsular glaucoma with pseudoexfoliation of lens, bilateral, moderate stage: Secondary | ICD-10-CM | POA: Diagnosis not present

## 2022-11-30 DIAGNOSIS — H04123 Dry eye syndrome of bilateral lacrimal glands: Secondary | ICD-10-CM | POA: Diagnosis not present

## 2022-11-30 DIAGNOSIS — H524 Presbyopia: Secondary | ICD-10-CM | POA: Diagnosis not present

## 2022-12-02 DIAGNOSIS — Z23 Encounter for immunization: Secondary | ICD-10-CM | POA: Diagnosis not present

## 2023-01-15 ENCOUNTER — Other Ambulatory Visit: Payer: Self-pay | Admitting: Hematology

## 2023-01-15 DIAGNOSIS — D0512 Intraductal carcinoma in situ of left breast: Secondary | ICD-10-CM

## 2023-01-16 ENCOUNTER — Encounter: Payer: Self-pay | Admitting: Hematology

## 2023-01-25 DIAGNOSIS — E782 Mixed hyperlipidemia: Secondary | ICD-10-CM | POA: Diagnosis not present

## 2023-01-25 DIAGNOSIS — I480 Paroxysmal atrial fibrillation: Secondary | ICD-10-CM | POA: Diagnosis not present

## 2023-01-25 DIAGNOSIS — I10 Essential (primary) hypertension: Secondary | ICD-10-CM | POA: Diagnosis not present

## 2023-01-26 DIAGNOSIS — R55 Syncope and collapse: Secondary | ICD-10-CM | POA: Diagnosis not present

## 2023-01-26 DIAGNOSIS — R42 Dizziness and giddiness: Secondary | ICD-10-CM | POA: Diagnosis not present

## 2023-01-29 DIAGNOSIS — Z883 Allergy status to other anti-infective agents status: Secondary | ICD-10-CM | POA: Diagnosis not present

## 2023-01-29 DIAGNOSIS — Z86000 Personal history of in-situ neoplasm of breast: Secondary | ICD-10-CM | POA: Diagnosis not present

## 2023-01-29 DIAGNOSIS — J302 Other seasonal allergic rhinitis: Secondary | ICD-10-CM | POA: Diagnosis not present

## 2023-01-29 DIAGNOSIS — Z79899 Other long term (current) drug therapy: Secondary | ICD-10-CM | POA: Diagnosis not present

## 2023-01-29 DIAGNOSIS — Z7901 Long term (current) use of anticoagulants: Secondary | ICD-10-CM | POA: Diagnosis not present

## 2023-01-29 DIAGNOSIS — G4489 Other headache syndrome: Secondary | ICD-10-CM | POA: Diagnosis not present

## 2023-01-29 DIAGNOSIS — Z8249 Family history of ischemic heart disease and other diseases of the circulatory system: Secondary | ICD-10-CM | POA: Diagnosis not present

## 2023-01-29 DIAGNOSIS — I444 Left anterior fascicular block: Secondary | ICD-10-CM | POA: Diagnosis not present

## 2023-01-29 DIAGNOSIS — Z79810 Long term (current) use of selective estrogen receptor modulators (SERMs): Secondary | ICD-10-CM | POA: Diagnosis not present

## 2023-01-29 DIAGNOSIS — R220 Localized swelling, mass and lump, head: Secondary | ICD-10-CM | POA: Diagnosis not present

## 2023-01-29 DIAGNOSIS — Z20822 Contact with and (suspected) exposure to covid-19: Secondary | ICD-10-CM | POA: Diagnosis not present

## 2023-01-29 DIAGNOSIS — K449 Diaphragmatic hernia without obstruction or gangrene: Secondary | ICD-10-CM | POA: Diagnosis not present

## 2023-01-29 DIAGNOSIS — Z90721 Acquired absence of ovaries, unilateral: Secondary | ICD-10-CM | POA: Diagnosis not present

## 2023-01-29 DIAGNOSIS — Z9849 Cataract extraction status, unspecified eye: Secondary | ICD-10-CM | POA: Diagnosis not present

## 2023-01-29 DIAGNOSIS — Z803 Family history of malignant neoplasm of breast: Secondary | ICD-10-CM | POA: Diagnosis not present

## 2023-01-29 DIAGNOSIS — Z9049 Acquired absence of other specified parts of digestive tract: Secondary | ICD-10-CM | POA: Diagnosis not present

## 2023-01-29 DIAGNOSIS — D0512 Intraductal carcinoma in situ of left breast: Secondary | ICD-10-CM | POA: Diagnosis not present

## 2023-01-29 DIAGNOSIS — I700 Atherosclerosis of aorta: Secondary | ICD-10-CM | POA: Diagnosis not present

## 2023-01-29 DIAGNOSIS — Z882 Allergy status to sulfonamides status: Secondary | ICD-10-CM | POA: Diagnosis not present

## 2023-01-29 DIAGNOSIS — Z818 Family history of other mental and behavioral disorders: Secondary | ICD-10-CM | POA: Diagnosis not present

## 2023-01-29 DIAGNOSIS — E7800 Pure hypercholesterolemia, unspecified: Secondary | ICD-10-CM | POA: Diagnosis not present

## 2023-01-29 DIAGNOSIS — I10 Essential (primary) hypertension: Secondary | ICD-10-CM | POA: Diagnosis not present

## 2023-01-29 DIAGNOSIS — S0081XA Abrasion of other part of head, initial encounter: Secondary | ICD-10-CM | POA: Diagnosis not present

## 2023-01-29 DIAGNOSIS — I480 Paroxysmal atrial fibrillation: Secondary | ICD-10-CM | POA: Diagnosis not present

## 2023-01-29 DIAGNOSIS — R55 Syncope and collapse: Secondary | ICD-10-CM | POA: Diagnosis not present

## 2023-01-29 DIAGNOSIS — Z881 Allergy status to other antibiotic agents status: Secondary | ICD-10-CM | POA: Diagnosis not present

## 2023-01-30 DIAGNOSIS — D0512 Intraductal carcinoma in situ of left breast: Secondary | ICD-10-CM | POA: Diagnosis not present

## 2023-01-30 DIAGNOSIS — I10 Essential (primary) hypertension: Secondary | ICD-10-CM | POA: Diagnosis not present

## 2023-01-30 DIAGNOSIS — I358 Other nonrheumatic aortic valve disorders: Secondary | ICD-10-CM | POA: Diagnosis not present

## 2023-01-30 DIAGNOSIS — R55 Syncope and collapse: Secondary | ICD-10-CM | POA: Diagnosis not present

## 2023-01-30 DIAGNOSIS — I480 Paroxysmal atrial fibrillation: Secondary | ICD-10-CM | POA: Diagnosis not present

## 2023-01-30 DIAGNOSIS — R519 Headache, unspecified: Secondary | ICD-10-CM | POA: Diagnosis not present

## 2023-01-30 DIAGNOSIS — S060X9A Concussion with loss of consciousness of unspecified duration, initial encounter: Secondary | ICD-10-CM | POA: Diagnosis not present

## 2023-02-01 ENCOUNTER — Inpatient Hospital Stay: Payer: Medicare Other | Admitting: Hematology

## 2023-02-01 ENCOUNTER — Inpatient Hospital Stay: Payer: Medicare Other

## 2023-02-02 DIAGNOSIS — I10 Essential (primary) hypertension: Secondary | ICD-10-CM | POA: Diagnosis not present

## 2023-02-02 DIAGNOSIS — R55 Syncope and collapse: Secondary | ICD-10-CM | POA: Diagnosis not present

## 2023-02-02 DIAGNOSIS — Z09 Encounter for follow-up examination after completed treatment for conditions other than malignant neoplasm: Secondary | ICD-10-CM | POA: Diagnosis not present

## 2023-02-02 DIAGNOSIS — Z299 Encounter for prophylactic measures, unspecified: Secondary | ICD-10-CM | POA: Diagnosis not present

## 2023-02-17 DIAGNOSIS — R55 Syncope and collapse: Secondary | ICD-10-CM | POA: Diagnosis not present

## 2023-02-17 DIAGNOSIS — I480 Paroxysmal atrial fibrillation: Secondary | ICD-10-CM | POA: Diagnosis not present

## 2023-02-17 DIAGNOSIS — I471 Supraventricular tachycardia, unspecified: Secondary | ICD-10-CM | POA: Diagnosis not present

## 2023-02-22 ENCOUNTER — Inpatient Hospital Stay: Payer: Medicare Other

## 2023-02-22 ENCOUNTER — Other Ambulatory Visit (HOSPITAL_COMMUNITY): Payer: Self-pay | Admitting: Hematology

## 2023-02-22 ENCOUNTER — Inpatient Hospital Stay: Payer: Medicare Other | Attending: Hematology

## 2023-02-22 ENCOUNTER — Inpatient Hospital Stay: Payer: Medicare Other | Admitting: Hematology

## 2023-02-22 VITALS — HR 83 | Temp 98.6°F | Resp 16 | Wt 171.5 lb

## 2023-02-22 DIAGNOSIS — Z9889 Other specified postprocedural states: Secondary | ICD-10-CM

## 2023-02-22 DIAGNOSIS — M810 Age-related osteoporosis without current pathological fracture: Secondary | ICD-10-CM | POA: Diagnosis not present

## 2023-02-22 DIAGNOSIS — E559 Vitamin D deficiency, unspecified: Secondary | ICD-10-CM

## 2023-02-22 DIAGNOSIS — D0512 Intraductal carcinoma in situ of left breast: Secondary | ICD-10-CM | POA: Diagnosis not present

## 2023-02-22 DIAGNOSIS — Z79810 Long term (current) use of selective estrogen receptor modulators (SERMs): Secondary | ICD-10-CM | POA: Diagnosis not present

## 2023-02-22 LAB — CBC WITH DIFFERENTIAL/PLATELET
Abs Immature Granulocytes: 0.01 10*3/uL (ref 0.00–0.07)
Basophils Absolute: 0 10*3/uL (ref 0.0–0.1)
Basophils Relative: 0 %
Eosinophils Absolute: 0 10*3/uL (ref 0.0–0.5)
Eosinophils Relative: 0 %
HCT: 39.7 % (ref 36.0–46.0)
Hemoglobin: 13.1 g/dL (ref 12.0–15.0)
Immature Granulocytes: 0 %
Lymphocytes Relative: 43 %
Lymphs Abs: 2.5 10*3/uL (ref 0.7–4.0)
MCH: 32.7 pg (ref 26.0–34.0)
MCHC: 33 g/dL (ref 30.0–36.0)
MCV: 99 fL (ref 80.0–100.0)
Monocytes Absolute: 0.6 10*3/uL (ref 0.1–1.0)
Monocytes Relative: 10 %
Neutro Abs: 2.7 10*3/uL (ref 1.7–7.7)
Neutrophils Relative %: 47 %
Platelets: 235 10*3/uL (ref 150–400)
RBC: 4.01 MIL/uL (ref 3.87–5.11)
RDW: 11.9 % (ref 11.5–15.5)
WBC: 5.9 10*3/uL (ref 4.0–10.5)
nRBC: 0 % (ref 0.0–0.2)

## 2023-02-22 LAB — COMPREHENSIVE METABOLIC PANEL
ALT: 13 U/L (ref 0–44)
AST: 21 U/L (ref 15–41)
Albumin: 3.9 g/dL (ref 3.5–5.0)
Alkaline Phosphatase: 36 U/L — ABNORMAL LOW (ref 38–126)
Anion gap: 9 (ref 5–15)
BUN: 14 mg/dL (ref 8–23)
CO2: 26 mmol/L (ref 22–32)
Calcium: 9.3 mg/dL (ref 8.9–10.3)
Chloride: 101 mmol/L (ref 98–111)
Creatinine, Ser: 1.01 mg/dL — ABNORMAL HIGH (ref 0.44–1.00)
GFR, Estimated: 55 mL/min — ABNORMAL LOW (ref >60)
Glucose, Bld: 99 mg/dL (ref 70–99)
Potassium: 4.1 mmol/L (ref 3.5–5.1)
Sodium: 136 mmol/L (ref 135–145)
Total Bilirubin: 0.2 mg/dL (ref 0.0–1.2)
Total Protein: 7.3 g/dL (ref 6.5–8.1)

## 2023-02-22 MED ORDER — DENOSUMAB 60 MG/ML ~~LOC~~ SOSY
60.0000 mg | PREFILLED_SYRINGE | Freq: Once | SUBCUTANEOUS | Status: AC
  Administered 2023-02-22: 60 mg via SUBCUTANEOUS
  Filled 2023-02-22: qty 1

## 2023-02-22 NOTE — Patient Instructions (Addendum)
 [facility] Discharge Instructions   You were seen and examined today by Dr. Rogers.  He reviewed the results of your lab work which are normal/stable.   We will proceed with your Prolia  injection today.   You may take 1/2 a tablet of Tamoxifen  daily to see if this helps with your joint pains.   We will see you back in 6 months. We will repeat lab work at that time.   Return as scheduled.    Thank you for choosing [facility] to provide your oncology and hematology care.  To afford each patient quality time with our provider, please arrive at least 15 minutes before your scheduled appointment time.   If you have a lab appointment with the Cancer Center please come in thru the Main Entrance and check in at the main information desk.  You need to re-schedule your appointment should you arrive 10 or more minutes late.  We strive to give you quality time with our providers, and arriving late affects you and other patients whose appointments are after yours.  Also, if you no show three or more times for appointments you may be dismissed from the clinic at the providers discretion.     Again, thank you for choosing [facility].  Our hope is that these requests will decrease the amount of time that you wait before being seen by our physicians.       _____________________________________________________________  Should you have questions after your visit to [facility], please contact our office at [phone] and follow the prompts.  Our office hours are 8:00 a.m. and 4:30 p.m. Monday - Friday.  Please note that voicemails left after 4:00 p.m. may not be returned until the following business day.  We are closed weekends and major holidays.  You do have access to a nurse 24-7, just call the main number to the clinic [phone] and do not press any options, hold on the line and a nurse will  answer the phone.    For prescription refill requests, have your pharmacy contact our office and allow 72 hours.    Due to Covid, you will need to wear a mask upon entering the hospital. If you do not have a mask, a mask will be given to you at the Main Entrance upon arrival. For doctor visits, patients may have 1 support person age 71 or older with them. For treatment visits, patients can not have anyone with them due to social distancing guidelines and our immunocompromised population.

## 2023-02-22 NOTE — Progress Notes (Signed)
 Sherry Smith presents today for injection per the provider's orders.  Prolia  60 mg administration without incident; injection site WNL; see MAR for injection details.  Patient tolerated procedure well and without incident.  No questions or complaints noted at this time. Patient's Calcium noted to be 9.3 today.  Patient denies any tooth or jaw pain and no recent or future dental appointments at this time. Patient reports taking Calcium/Vit D supplements as directed.  Discharged from clinic ambulatory in stable condition. Alert and oriented x 3. F/U with [facility] as scheduled.

## 2023-02-22 NOTE — Patient Instructions (Signed)
 [facility] [place] - A DEPT OF [facility]  Discharge Instructions: Thank you for choosing [facility] to provide your oncology and hematology care.  If you have a lab appointment with the Cancer Center - please note that after April 8th, 2024, all labs will be drawn in the cancer center.  You do not have to check in or register with the main entrance as you have in the past but will complete your check-in in the cancer center.  Wear comfortable clothing and clothing appropriate for easy access to any Portacath or PICC line.   We strive to give you quality time with your provider. You may need to reschedule your appointment if you arrive late (15 or more minutes).  Arriving late affects you and other patients whose appointments are after yours.  Also, if you miss three or more appointments without notifying the office, you may be dismissed from the clinic at the provider's discretion.      For prescription refill requests, have your pharmacy contact our office and allow 72 hours for refills to be completed.    Today you received Prolia injection     BELOW ARE SYMPTOMS THAT SHOULD BE REPORTED IMMEDIATELY: *FEVER GREATER THAN 100.4 F (38 C) OR HIGHER *CHILLS OR SWEATING *NAUSEA AND VOMITING THAT IS NOT CONTROLLED WITH YOUR NAUSEA MEDICATION *UNUSUAL SHORTNESS OF BREATH *UNUSUAL BRUISING OR BLEEDING *URINARY PROBLEMS (pain or burning when urinating, or frequent urination) *BOWEL PROBLEMS (unusual diarrhea, constipation, pain near the anus) TENDERNESS IN MOUTH AND THROAT WITH OR WITHOUT PRESENCE OF ULCERS (sore throat, sores in mouth, or a toothache) UNUSUAL RASH, SWELLING OR PAIN  UNUSUAL VAGINAL DISCHARGE OR ITCHING   Items with * indicate a potential emergency and should be followed up as soon as possible or go to the Emergency Department if any problems should occur.  Please show the CHEMOTHERAPY ALERT CARD or IMMUNOTHERAPY ALERT CARD at check-in  to the Emergency Department and triage nurse.  Should you have questions after your visit or need to cancel or reschedule your appointment, please contact [facility] [place] - A DEPT OF [facility]igh[facility] [phone]  and follow the prompts.  Office hours are 8:00 a.m. to 4:30 p.m. Monday - Friday. Please note that voicemails left after 4:00 p.m. may not be returned until the following business day.  We are closed weekends and major holidays. You have access to a nurse at all times for urgent questions. Please call the main number to the clinic [phone] and follow the prompts.  For any non-urgent questions, you may also contact your provider using MyChart. We now offer e-Visits for anyone 20 and older to request care online for non-urgent symptoms. For details visit [email or web address].   Also download the MyChart app! Go to the app store, search "MyChart", open the app, select [place], and log in with your MyChart username and password.

## 2023-02-22 NOTE — Progress Notes (Signed)
 [facility] [address] [place] [address]    Clinic Day:  02/22/2023  Referring physician: [name], MD  Patient Care Team: [name], MD as PCP - General (Internal Medicine) [name], MD as Medical Oncologist (Medical Oncology) [name], RN as Oncology Nurse Navigator (Medical Oncology)   ASSESSMENT & PLAN:   Assessment: 1.  High-grade DCIS of the left breast: - Bilateral diagnostic mammogram (10/27/2021): 0.7 cm group of calcifications in the upper central left breast. - Left breast core biopsy 12:00 (11/10/2021): High-grade DCIS, solid type with necrosis.  Negative for invasive carcinoma.  ER 40%, PR 5% strong staining. - Left breast lumpectomy (12/17/2021): Grade 3 DCIS with central comedonecrosis and microcalcifications, 1.4 cm.  Margins negative.  Distance from DCIS to closest margin 4 mm.  pTis.  ER 40%, PR 5% - Anastrozole  started on 01/18/2022.  Anastrozole  discontinued due to joint pains.  Tamoxifen  started on 05/31/2022. - She met with Dr. Dannielle of radiation oncology.  As her prognosis was excellent, she was recommended against radiation.   2.  Social/family history: - She lives at home by herself.  She is independent of ADLs and IADLs.  She is accompanied by her daughter today.  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  She is a never smoker. - Sister had breast cancer in her late 48s.  Paternal aunt died of breast cancer.    Plan: 1.  High-grade DCIS of the left breast: - We switched her to tamoxifen  April 2024 due to joint pains from anastrozole . - She is tolerating tamoxifen  reasonably well.  However she reported slight worsening of back pain, shoulder pain and thumb joint pain.  Tamoxifen  can cause joint pains but at lower frequency. - Recommend decreasing tamoxifen  dose to 10 mg daily. - Reviewed mammogram from 11/01/2022: BI-RADS Category 3 with recommendations  to repeat left breast diagnostic mammogram in 6 months.  We will arrange that in March. - RTC 6 months for follow-up.   2.  Osteoporosis (DEXA 04/18/2022 T-3.8): - She received first dose of Prolia  on 08/02/2022. - Denies any side effects from it. - Continue calcium and vitamin D  supplements. - Labs reviewed today shows creatinine 1.01 and calcium 9.3.  She may proceed with her next dose of Prolia  today.    Orders Placed This Encounter  Procedures   MM DIAG BREAST TOMO UNI LEFT    Standing Status:   Future    Expected Date:   05/01/2023    Expiration Date:   02/22/2024    Reason for Exam (SYMPTOM  OR DIAGNOSIS REQUIRED):   six month f/u on abnormal mammogram    Preferred imaging location?:   [facility]   CBC with Differential    Standing Status:   Future    Expected Date:   08/15/2023    Expiration Date:   02/22/2024   Comprehensive metabolic panel    Standing Status:   Future    Expected Date:   08/15/2023    Expiration Date:   02/22/2024   VITAMIN D  25 Hydroxy (Vit-D Deficiency, Fractures)    Standing Status:   Future    Expected Date:   08/15/2023    Expiration Date:   02/22/2024      [name],acting as a scribe for Hai Rogers, MD.,have documented all relevant documentation on the behalf of Hai Rogers, MD,as directed by  Hai Rogers, MD  while in the presence of [name], MD.  I, [name] MD, have reviewed the above documentation for accuracy and completeness, and I agree with the above.    [name], MD   1/8/20254:43 PM  CHIEF COMPLAINT:   Diagnosis: DCIS of the left breast    Cancer Staging  No matching staging information was found for the patient.    Prior Therapy: left breast lumpectomy 12/17/2021   Current Therapy:  Anastrozole     HISTORY OF PRESENT ILLNESS:   Oncology History  Ductal carcinoma in situ (DCIS) of left breast  11/24/2021 Initial Diagnosis   Ductal carcinoma in situ (DCIS) of  left breast    Genetic Testing   Negative genetic testing. No pathogenic variants identified on the [facility] Common Hereditary Cancers+RNA panel. The report date is 03/06/2022.  The Common Hereditary Cancers Panel + RNA offered by [facility] includes sequencing and/or deletion duplication testing of the following 48 genes: APC*, ATM*, AXIN2, BAP1, BARD1, BMPR1A, BRCA1, BRCA2, BRIP1, CDH1, CDK4, CDKN2A (p14ARF), CDKN2A (p16INK4a), CHEK2, CTNNA1, DICER1*, EPCAM*, FH*, GREM1*, HOXB13, KIT, MBD4, MEN1*, MLH1*, MSH2*, MSH3*, MSH6*, MUTYH, NF1*, NTHL1, PALB2, PDGFRA, PMS2*, POLD1*, POLE, PTEN*, RAD51C, RAD51D, SDHA*, SDHB, SDHC*, SDHD, SMAD4, SMARCA4, STK11, TP53, TSC1*, TSC2, VHL.       INTERVAL HISTORY:   Sherry Smith is a 83 y.o. female presenting to clinic today for follow up of DCIS of the left breast. She was last seen by me on 08/02/22.  Since her last visit, she underwent bilateral diagnostic MM and left breast US  on 11/01/22 that found: new lumpectomy changes in the left breast with an asymmetry along the medial margin of the lumpectomy scar likely related to post surgical scarring and fluid, and no mammographic evidence of malignancy in either breast.  She presented to the ED on 01/29/23 for a syncopal episode likely due to vasovagal episodes.  Today, she states that she is doing well overall. Her appetite level is at 100%. Her energy level is at 40%.  PAST MEDICAL HISTORY:   Past Medical History: Past Medical History:  Diagnosis Date   Arthritis    Atrial fibrillation (HCC)    Chronic headaches 11/29/2016   Dysrhythmia     Surgical History: Past Surgical History:  Procedure Laterality Date   BREAST BIOPSY Left 11/10/2021   HIGH-GRADE DUCTAL CARCINOMA IN SITU, SOLID TYPE WITH NECROSIS, MICROCALCIFICATIONS PRESENT WITHIN DCIS   BREAST BIOPSY Right    benign   BREAST LUMPECTOMY Left 12/17/2021   Ductal carcinoma in situ (DCIS), grade III (high), with central  comedo necrosis and  microcalcifications., at least 14 mm in greatest  dimension.  -   All margins negative for DCIS.   CATARACT EXTRACTION, BILATERAL     Ruptured ovary     left ovary in the 60s    Social History: Social History   Socioeconomic History   Marital status: Widowed    Spouse name: Not on file   Number of children: Not on file   Years of education: Not on file   Highest education level: Not on file  Occupational History   Not on file  Tobacco Use   Smoking status: Never   Smokeless tobacco: Never  Substance and Sexual Activity   Alcohol  use: No   Drug use: No   Sexual activity: Not on file  Other Topics Concern   Not on file  Social History Narrative   Not on file   Social Drivers of Health   Financial Resource Strain: Low  Risk  (01/29/2023)   Received from [facility]   Overall Financial Resource Strain (CARDIA)    Difficulty of Paying Living Expenses: Not hard at all  Food Insecurity: No Food Insecurity (01/29/2023)   Received from [facility]   Hunger Vital Sign    Worried About Running Out of Food in the Last Year: Never true    Ran Out of Food in the Last Year: Never true  Transportation Needs: No Transportation Needs (01/29/2023)   Received from [facility] - Transportation    Lack of Transportation (Medical): No    Lack of Transportation (Non-Medical): No  Physical Activity: Insufficiently Active (01/29/2023)   Received from [facility]   Exercise Vital Sign    Days of Exercise per Week: 7 days    Minutes of Exercise per Session: 20 min  Stress: No Stress Concern Present (01/29/2023)   Received from [facility] - Occupational Stress Questionnaire    Feeling of Stress : Not at all  Social Connections: Unknown (01/29/2023)   Received from [facility]   Social Connection and Isolation Panel [NHANES]    Frequency of Communication with Friends and Family: More than three times a week     Frequency of Social Gatherings with Friends and Family: More than three times a week    Attends Religious Services: More than 4 times per year    Active Member of [employer] or Organizations: Yes    Attends Engineer, Structural: More than 4 times per year    Marital Status: Not on file  Intimate Partner Violence: Not At Risk (01/29/2023)   Received from [facility]   Humiliation, Afraid, Rape, and Kick questionnaire    Fear of Current or Ex-Partner: No    Emotionally Abused: No    Physically Abused: No    Sexually Abused: No    Family History: Family History  Problem Relation Age of Onset   Breast cancer Sister 70 - 21   Colon cancer Maternal Aunt 11 - 60   Brain cancer Maternal Uncle 50 - 80   Breast cancer Paternal Aunt 52 - 45       recurrence, d. 74s    Current Medications:  Current Outpatient Medications:    acetaminophen (TYLENOL) 325 MG tablet, Take 650 mg by mouth every 6 (six) hours as needed for moderate pain., Disp: , Rfl:    amoxicillin -clavulanate (AUGMENTIN ) 875-125 MG tablet, Take 1 tablet by mouth at bedtime., Disp: , Rfl:    Calcium Carb-Cholecalciferol (CALCIUM + VITAMIN D3 PO), Take 1 tablet by mouth daily., Disp: , Rfl:    ELIQUIS 5 MG TABS tablet, Take 5 mg by mouth 2 (two) times daily., Disp: , Rfl:    estradiol (ESTRACE) 0.1 MG/GM vaginal cream, Apply green pea sized amount to opening of vagina 3 times a week., Disp: , Rfl:    latanoprost (XALATAN) 0.005 % ophthalmic solution, Place 1 drop into both eyes at bedtime., Disp: , Rfl:    metoprolol tartrate (LOPRESSOR) 25 MG tablet, Take 12.5 mg by mouth 2 (two) times daily., Disp: , Rfl:    pantoprazole (PROTONIX) 40 MG tablet, Take 40 mg by mouth daily., Disp: , Rfl:    rosuvastatin (CRESTOR) 5 MG tablet, Take 5 mg by mouth once a week., Disp: , Rfl:    tamoxifen  (NOLVADEX ) 20 MG tablet, TAKE 1 TABLET BY MOUTH DAILY, Disp: 30 tablet, Rfl:  3   Allergies: Allergies  Allergen Reactions   Macrodantin  [Nitrofurantoin] Rash   Sulfa Antibiotics     Headaches.   Tetracyclines & Related     Yeast infection    REVIEW OF SYSTEMS:   Review of Systems  Constitutional:  Negative for chills, fatigue and fever.  HENT:   Negative for lump/mass, mouth sores, nosebleeds, sore throat and trouble swallowing.   Eyes:  Negative for eye problems.  Respiratory:  Positive for cough. Negative for shortness of breath.   Cardiovascular:  Negative for leg swelling and palpitations.  Gastrointestinal:  Positive for nausea. Negative for abdominal pain, constipation, diarrhea and vomiting.  Genitourinary:  Negative for bladder incontinence, difficulty urinating, dysuria, frequency, hematuria and nocturia.   Musculoskeletal:  Negative for arthralgias, back pain, flank pain, myalgias and neck pain.  Skin:  Negative for itching and rash.  Neurological:  Positive for dizziness and numbness. Negative for headaches.  Hematological:  Does not bruise/bleed easily.  Psychiatric/Behavioral:  Negative for depression, sleep disturbance and suicidal ideas. The patient is not nervous/anxious.   All other systems reviewed and are negative.    VITALS:   Pulse 83, temperature 98.6 F (37 C), temperature source Oral, resp. rate 16, weight 171 lb 8 oz (77.8 kg), SpO2 97%.  Wt Readings from Last 3 Encounters:  02/22/23 171 lb 8 oz (77.8 kg)  08/02/22 168 lb 14.4 oz (76.6 kg)  04/26/22 167 lb 8 oz (76 kg)    Body mass index is 31.37 kg/m.  Performance status (ECOG): 1 - Symptomatic but completely ambulatory  PHYSICAL EXAM:   Physical Exam Vitals and nursing note reviewed. Exam conducted with a chaperone present.  Constitutional:      Appearance: Normal appearance.  Cardiovascular:     Rate and Rhythm: Normal rate and regular rhythm.     Pulses: Normal pulses.     Heart sounds: Normal heart sounds.  Pulmonary:     Effort: Pulmonary effort is normal.     Breath sounds: Normal breath sounds.  Abdominal:      Palpations: Abdomen is soft. There is no hepatomegaly, splenomegaly or mass.     Tenderness: There is no abdominal tenderness.  Musculoskeletal:     Right lower leg: No edema.     Left lower leg: No edema.  Lymphadenopathy:     Cervical: No cervical adenopathy.     Right cervical: No superficial, deep or posterior cervical adenopathy.    Left cervical: No superficial, deep or posterior cervical adenopathy.     Upper Body:     Right upper body: No supraclavicular or axillary adenopathy.     Left upper body: No supraclavicular or axillary adenopathy.  Neurological:     General: No focal deficit present.     Mental Status: She is alert and oriented to person, place, and time.  Psychiatric:        Mood and Affect: Mood normal.        Behavior: Behavior normal.     LABS:      Latest Ref Rng & Units 02/22/2023    2:20 PM 04/18/2022    1:31 PM  CBC  WBC 4.0 - 10.5 K/uL 5.9  4.7   Hemoglobin 12.0 - 15.0 g/dL 86.8  87.5   Hematocrit 36.0 - 46.0 % 39.7  37.9   Platelets 150 - 400 K/uL 235  262       Latest Ref Rng & Units 02/22/2023    2:20 PM 08/02/2022  2:22 PM 04/18/2022    1:31 PM  CMP  Glucose 70 - 99 mg/dL 99  888  894   BUN 8 - 23 mg/dL 14  15  18    Creatinine 0.44 - 1.00 mg/dL 8.98  9.14  9.09   Sodium 135 - 145 mmol/L 136  135  136   Potassium 3.5 - 5.1 mmol/L 4.1  4.1  4.0   Chloride 98 - 111 mmol/L 101  102  103   CO2 22 - 32 mmol/L 26  21  25    Calcium 8.9 - 10.3 mg/dL 9.3  8.8  9.1   Total Protein 6.5 - 8.1 g/dL 7.3  7.3  7.5   Total Bilirubin 0.0 - 1.2 mg/dL 0.2  0.4  0.5   Alkaline Phos 38 - 126 U/L 36  44  59   AST 15 - 41 U/L 21  23  21    ALT 0 - 44 U/L 13  16  14       No results found for: CEA1, CEA / No results found for: CEA1, CEA No results found for: PSA1 No results found for: CAN199 No results found for: CAN125  No results found for: TOTALPROTELP, ALBUMINELP, A1GS, A2GS, BETS, BETA2SER, GAMS, MSPIKE, SPEI No results  found for: TIBC, FERRITIN, IRONPCTSAT No results found for: LDH   STUDIES:   No results found.

## 2023-02-28 DIAGNOSIS — R55 Syncope and collapse: Secondary | ICD-10-CM | POA: Diagnosis not present

## 2023-02-28 DIAGNOSIS — I10 Essential (primary) hypertension: Secondary | ICD-10-CM | POA: Diagnosis not present

## 2023-02-28 DIAGNOSIS — I480 Paroxysmal atrial fibrillation: Secondary | ICD-10-CM | POA: Diagnosis not present

## 2023-04-11 ENCOUNTER — Encounter: Payer: Self-pay | Admitting: Hematology

## 2023-04-11 DIAGNOSIS — H35363 Drusen (degenerative) of macula, bilateral: Secondary | ICD-10-CM | POA: Diagnosis not present

## 2023-04-11 DIAGNOSIS — H35033 Hypertensive retinopathy, bilateral: Secondary | ICD-10-CM | POA: Diagnosis not present

## 2023-04-11 DIAGNOSIS — H401432 Capsular glaucoma with pseudoexfoliation of lens, bilateral, moderate stage: Secondary | ICD-10-CM | POA: Diagnosis not present

## 2023-04-11 DIAGNOSIS — H26493 Other secondary cataract, bilateral: Secondary | ICD-10-CM | POA: Diagnosis not present

## 2023-04-14 ENCOUNTER — Encounter: Payer: Self-pay | Admitting: Urology

## 2023-04-14 ENCOUNTER — Ambulatory Visit: Payer: Medicare Other | Admitting: Urology

## 2023-04-14 VITALS — BP 145/92 | HR 73

## 2023-04-14 DIAGNOSIS — Z87440 Personal history of urinary (tract) infections: Secondary | ICD-10-CM | POA: Diagnosis not present

## 2023-04-14 DIAGNOSIS — N390 Urinary tract infection, site not specified: Secondary | ICD-10-CM | POA: Diagnosis not present

## 2023-04-14 DIAGNOSIS — Z09 Encounter for follow-up examination after completed treatment for conditions other than malignant neoplasm: Secondary | ICD-10-CM | POA: Diagnosis not present

## 2023-04-14 LAB — URINALYSIS, ROUTINE W REFLEX MICROSCOPIC
Bilirubin, UA: NEGATIVE
Glucose, UA: NEGATIVE
Ketones, UA: NEGATIVE
Nitrite, UA: NEGATIVE
Protein,UA: NEGATIVE
RBC, UA: NEGATIVE
Specific Gravity, UA: 1.01 (ref 1.005–1.030)
Urobilinogen, Ur: 0.2 mg/dL (ref 0.2–1.0)
pH, UA: 6 (ref 5.0–7.5)

## 2023-04-14 LAB — MICROSCOPIC EXAMINATION: Epithelial Cells (non renal): >10 /HPF — AB (ref 0–10)

## 2023-04-14 MED ORDER — AMOXICILLIN-POT CLAVULANATE 875-125 MG PO TABS: 1.0000 | ORAL_TABLET | Freq: Every day | ORAL | 11 refills | Status: AC

## 2023-04-14 NOTE — Patient Instructions (Signed)

## 2023-04-14 NOTE — Progress Notes (Signed)
 04/14/2023 9:37 AM   Barnabas Harries 05/29/1940 [id number]  Referring provider: Ignatius Specking, MD [address],  [place] [address]  Followup UTI   HPI: Ms Braud is a 83yo here for followup fro recurrent UTI.  She noted increased abdominal cramping over the past week. UA shows 6-10 WBCs and few bacteria. She denies dysuria.NO worsening urinary frequency and urgency. She has nocturia 2x which is her baseline. No hematuria   PMH: Past Medical History:  Diagnosis Date   Arthritis    Atrial fibrillation (HCC)    Chronic headaches 11/29/2016   Dysrhythmia     Surgical History: Past Surgical History:  Procedure Laterality Date   BREAST BIOPSY Left 11/10/2021   HIGH-GRADE DUCTAL CARCINOMA IN SITU, SOLID TYPE WITH NECROSIS, MICROCALCIFICATIONS PRESENT WITHIN DCIS   BREAST BIOPSY Right    benign   BREAST LUMPECTOMY Left 12/17/2021   Ductal carcinoma in situ (DCIS), grade III (high), with central  "comedo" necrosis and microcalcifications., at least 14 mm in greatest  dimension.  -   All margins negative for DCIS.   CATARACT EXTRACTION, BILATERAL     Ruptured ovary     left ovary in the 60s    Home Medications:  Allergies as of 04/14/2023       Reactions   Macrodantin [nitrofurantoin] Rash   Sulfa Antibiotics    Headaches.   Tetracyclines & Related    Yeast infection        Medication List        Accurate as of April 14, 2023  9:37 AM. If you have any questions, ask your nurse or doctor.          acetaminophen 325 MG tablet Commonly known as: TYLENOL Take 650 mg by mouth every 6 (six) hours as needed for moderate pain.   amoxicillin-clavulanate 875-125 MG tablet Commonly known as: AUGMENTIN Take 1 tablet by mouth at bedtime.   CALCIUM + VITAMIN D3 PO Take 1 tablet by mouth daily.   Eliquis 5 MG Tabs tablet Generic drug: apixaban Take 5 mg by mouth 2 (two) times daily.   estradiol 0.1 MG/GM vaginal cream Commonly known as: ESTRACE Apply "green  pea" sized amount to opening of vagina 3 times a week.   latanoprost 0.005 % ophthalmic solution Commonly known as: XALATAN Place 1 drop into both eyes at bedtime.   metoprolol tartrate 25 MG tablet Commonly known as: LOPRESSOR Take 12.5 mg by mouth 2 (two) times daily.   pantoprazole 40 MG tablet Commonly known as: PROTONIX Take 40 mg by mouth daily.   rosuvastatin 5 MG tablet Commonly known as: CRESTOR Take 5 mg by mouth once a week.   tamoxifen 20 MG tablet Commonly known as: NOLVADEX TAKE 1 TABLET BY MOUTH DAILY        Allergies:  Allergies  Allergen Reactions   Macrodantin [Nitrofurantoin] Rash   Sulfa Antibiotics     Headaches.   Tetracyclines & Related     Yeast infection    Family History: Family History  Problem Relation Age of Onset   Breast cancer Sister 45 - 30   Colon cancer Maternal Aunt 44 - 23   Brain cancer Maternal Uncle 93 - 80   Breast cancer Paternal Aunt 45 - 45       recurrence, d. 33s    Social History:  reports that she has never smoked. She has never used smokeless tobacco. She reports that she does not drink alcohol and does not  use drugs.  ROS: All other review of systems were reviewed and are negative except what is noted above in HPI  Physical Exam: There were no vitals taken for this visit.  Constitutional:  Alert and oriented, No acute distress. HEENT: [place] AT, moist mucus membranes.  Trachea midline, no masses. Cardiovascular: No clubbing, cyanosis, or edema. Respiratory: Normal respiratory effort, no increased work of breathing. GI: Abdomen is soft, nontender, nondistended, no abdominal masses GU: No CVA tenderness.  Lymph: No cervical or inguinal lymphadenopathy. Skin: No rashes, bruises or suspicious lesions. Neurologic: Grossly intact, no focal deficits, moving all 4 extremities. Psychiatric: Normal mood and affect.  Laboratory Data: Lab Results  Component Value Date   WBC 5.9 02/22/2023   HGB 13.1 02/22/2023    HCT 39.7 02/22/2023   MCV 99.0 02/22/2023   PLT 235 02/22/2023    Lab Results  Component Value Date   CREATININE 1.01 (H) 02/22/2023    No results found for: "PSA"  No results found for: "TESTOSTERONE"  No results found for: "HGBA1C"  Urinalysis    Component Value Date/Time   APPEARANCEUR Clear 10/10/2022 1334   GLUCOSEU Negative 10/10/2022 1334   BILIRUBINUR Negative 10/10/2022 1334   PROTEINUR Negative 10/10/2022 1334   NITRITE Negative 10/10/2022 1334   LEUKOCYTESUR Negative 10/10/2022 1334    Lab Results  Component Value Date   LABMICR Comment 10/10/2022   WBCUA >30 (A) 07/08/2022   LABEPIT 0-10 07/08/2022   MUCUS Present 09/09/2021   BACTERIA Many (A) 07/08/2022    Pertinent Imaging:  No results found for this or any previous visit.  No results found for this or any previous visit.  No results found for this or any previous visit.  No results found for this or any previous visit.  No results found for this or any previous visit.  No results found for this or any previous visit.  No results found for this or any previous visit.  Results for orders placed in visit on 09/09/21  CT RENAL STONE STUDY  Narrative CLINICAL DATA:  Lower abdominal and flank pain for couple months, bladder pain, acute cystitis with hematuria, recurrent UTIs, suspected kidney stone  EXAM: CT ABDOMEN AND PELVIS WITHOUT CONTRAST  TECHNIQUE: Multidetector CT imaging of the abdomen and pelvis was performed following the standard protocol without IV contrast.  RADIATION DOSE REDUCTION: This exam was performed according to the departmental dose-optimization program which includes automated exposure control, adjustment of the mA and/or kV according to patient size and/or use of iterative reconstruction technique.  COMPARISON:  None  FINDINGS: Lower chest: Elevation of LEFT diaphragm with LEFT basilar atelectasis  Hepatobiliary: Large calcified gallstone in gallbladder  2.1 cm diameter. Gallbladder and liver otherwise normal appearance  Pancreas: Atrophic pancreas without mass  Spleen: Normal appearance  Adrenals/Urinary Tract: Adrenal glands normal appearance. Tiny peripelvic cysts LEFT kidney; no follow-up imaging recommended. Kidneys, ureters, and bladder otherwise normal appearance.  Stomach/Bowel: Appendix not visualized. No pericecal inflammatory process seen. Stomach decompressed. Large and small bowel loops unremarkable.  Vascular/Lymphatic: Atherosclerotic calcifications aorta and iliac arteries without aneurysm. No adenopathy.  Reproductive: Atrophic uterus and ovaries  Other: No free air or free fluid. LEFT inguinal hernia containing fat. Small umbilical hernia containing fat. No definite inflammatory process.  Musculoskeletal: Osseous demineralization. Rotatory scoliosis of the thoracolumbar spine. Scattered degenerative disc and facet disease changes thoracolumbar spine.  IMPRESSION: Cholelithiasis without evidence of acute cholecystitis.  LEFT inguinal and umbilical hernias containing fat.  No acute intra-abdominal or intrapelvic abnormalities.  Aortic Atherosclerosis (ICD10-I70.0).   Electronically Signed By: Ulyses Southward M.D. On: 09/15/2021 17:05   Assessment & Plan:    1. Recurrent UTI (Primary) Urine for culture Continue augmentin 500mg  daily for prophylaxis.   No follow-ups on file.  Wilkie Aye, MD  [facility] [place]

## 2023-04-17 ENCOUNTER — Telehealth: Payer: Self-pay | Admitting: Urology

## 2023-04-17 LAB — URINE CULTURE

## 2023-04-17 NOTE — Telephone Encounter (Signed)
 Patient informed urine culture has not yet resulted.  Patient advised to increase fluids and she will be contacted as soon and we receive her urine culture results. Patient voiced understanding.

## 2023-04-17 NOTE — Telephone Encounter (Signed)
 Patient saw McKenzie on Friday and she is having pain in lower bladder. Would like to talk to a nurse to see what she can do.

## 2023-04-19 MED ORDER — AMOXICILLIN-POT CLAVULANATE 875-125 MG PO TABS: 1.0000 | ORAL_TABLET | Freq: Two times a day (BID) | ORAL | 0 refills | Status: DC

## 2023-04-19 NOTE — Telephone Encounter (Signed)
 Patient made aware of culture results.  Per MD Augmentin 1 tab po bid sent to pharmacy.  Patient made aware to stop nightly prophylaxis until full week course is complete. Patient voiced understanding.

## 2023-04-19 NOTE — Addendum Note (Signed)
 Addended byGustavus Messing on: 04/19/2023 08:47 AM   Modules accepted: Orders

## 2023-05-02 ENCOUNTER — Ambulatory Visit (HOSPITAL_COMMUNITY)
Admission: RE | Admit: 2023-05-02 | Discharge: 2023-05-02 | Disposition: A | Discharge status: Home / Self Care | Payer: Medicare Other | Source: Ambulatory Visit | Attending: Hematology | Admitting: Hematology

## 2023-05-02 DIAGNOSIS — R928 Other abnormal and inconclusive findings on diagnostic imaging of breast: Secondary | ICD-10-CM | POA: Diagnosis not present

## 2023-05-02 DIAGNOSIS — R92333 Mammographic heterogeneous density, bilateral breasts: Secondary | ICD-10-CM | POA: Diagnosis not present

## 2023-05-02 DIAGNOSIS — D0512 Intraductal carcinoma in situ of left breast: Secondary | ICD-10-CM | POA: Insufficient documentation

## 2023-05-02 DIAGNOSIS — Z9889 Other specified postprocedural states: Secondary | ICD-10-CM | POA: Diagnosis not present

## 2023-05-04 DIAGNOSIS — I10 Essential (primary) hypertension: Secondary | ICD-10-CM | POA: Diagnosis not present

## 2023-05-04 DIAGNOSIS — I700 Atherosclerosis of aorta: Secondary | ICD-10-CM | POA: Diagnosis not present

## 2023-05-04 DIAGNOSIS — R55 Syncope and collapse: Secondary | ICD-10-CM | POA: Diagnosis not present

## 2023-05-04 DIAGNOSIS — C50912 Malignant neoplasm of unspecified site of left female breast: Secondary | ICD-10-CM | POA: Diagnosis not present

## 2023-05-04 DIAGNOSIS — I4891 Unspecified atrial fibrillation: Secondary | ICD-10-CM | POA: Diagnosis not present

## 2023-05-04 DIAGNOSIS — Z299 Encounter for prophylactic measures, unspecified: Secondary | ICD-10-CM | POA: Diagnosis not present

## 2023-05-10 DIAGNOSIS — H401432 Capsular glaucoma with pseudoexfoliation of lens, bilateral, moderate stage: Secondary | ICD-10-CM | POA: Diagnosis not present

## 2023-06-13 DIAGNOSIS — H401432 Capsular glaucoma with pseudoexfoliation of lens, bilateral, moderate stage: Secondary | ICD-10-CM | POA: Diagnosis not present

## 2023-06-16 ENCOUNTER — Other Ambulatory Visit: Payer: Self-pay | Admitting: Urology

## 2023-07-25 ENCOUNTER — Other Ambulatory Visit: Payer: Self-pay | Admitting: Hematology

## 2023-07-25 ENCOUNTER — Other Ambulatory Visit: Payer: Self-pay | Admitting: *Deleted

## 2023-07-25 DIAGNOSIS — D0512 Intraductal carcinoma in situ of left breast: Secondary | ICD-10-CM

## 2023-07-25 NOTE — Telephone Encounter (Signed)
 Tamixifen refill approved.  Patient is tolerating and is to continue therapy at this time.

## 2023-08-16 ENCOUNTER — Inpatient Hospital Stay: Payer: Medicare Other | Attending: Hematology

## 2023-08-16 DIAGNOSIS — Z79810 Long term (current) use of selective estrogen receptor modulators (SERMs): Secondary | ICD-10-CM | POA: Insufficient documentation

## 2023-08-16 DIAGNOSIS — D0512 Intraductal carcinoma in situ of left breast: Secondary | ICD-10-CM | POA: Diagnosis not present

## 2023-08-16 DIAGNOSIS — E559 Vitamin D deficiency, unspecified: Secondary | ICD-10-CM

## 2023-08-16 DIAGNOSIS — M810 Age-related osteoporosis without current pathological fracture: Secondary | ICD-10-CM | POA: Diagnosis not present

## 2023-08-16 LAB — CBC WITH DIFFERENTIAL/PLATELET
Abs Immature Granulocytes: 0.01 10*3/uL (ref 0.00–0.07)
Basophils Absolute: 0 10*3/uL (ref 0.0–0.1)
Basophils Relative: 1 %
Eosinophils Absolute: 0 10*3/uL (ref 0.0–0.5)
Eosinophils Relative: 0 %
HCT: 39.1 % (ref 36.0–46.0)
Hemoglobin: 12.1 g/dL (ref 12.0–15.0)
Immature Granulocytes: 0 %
Lymphocytes Relative: 43 %
Lymphs Abs: 1.9 10*3/uL (ref 0.7–4.0)
MCH: 30.9 pg (ref 26.0–34.0)
MCHC: 30.9 g/dL (ref 30.0–36.0)
MCV: 100 fL (ref 80.0–100.0)
Monocytes Absolute: 0.4 10*3/uL (ref 0.1–1.0)
Monocytes Relative: 10 %
Neutro Abs: 2 10*3/uL (ref 1.7–7.7)
Neutrophils Relative %: 46 %
Platelets: 219 10*3/uL (ref 150–400)
RBC: 3.91 MIL/uL (ref 3.87–5.11)
RDW: 12.2 % (ref 11.5–15.5)
WBC: 4.5 10*3/uL (ref 4.0–10.5)
nRBC: 0 % (ref 0.0–0.2)

## 2023-08-16 LAB — COMPREHENSIVE METABOLIC PANEL WITH GFR
ALT: 9 U/L (ref 0–44)
AST: 17 U/L (ref 15–41)
Albumin: 3.5 g/dL (ref 3.5–5.0)
Alkaline Phosphatase: 35 U/L — ABNORMAL LOW (ref 38–126)
Anion gap: 13 (ref 5–15)
BUN: 14 mg/dL (ref 8–23)
CO2: 25 mmol/L (ref 22–32)
Calcium: 9.3 mg/dL (ref 8.9–10.3)
Chloride: 104 mmol/L (ref 98–111)
Creatinine, Ser: 0.84 mg/dL (ref 0.44–1.00)
GFR, Estimated: >60 mL/min (ref >60)
Glucose, Bld: 120 mg/dL — ABNORMAL HIGH (ref 70–99)
Potassium: 4.2 mmol/L (ref 3.5–5.1)
Sodium: 142 mmol/L (ref 135–145)
Total Bilirubin: 0.3 mg/dL (ref 0.0–1.2)
Total Protein: 6.8 g/dL (ref 6.5–8.1)

## 2023-08-16 LAB — VITAMIN D 25 HYDROXY (VIT D DEFICIENCY, FRACTURES): Vit D, 25-Hydroxy: 32 ng/mL (ref 30–100)

## 2023-08-21 ENCOUNTER — Other Ambulatory Visit (HOSPITAL_COMMUNITY): Payer: Self-pay | Admitting: Hematology

## 2023-08-21 DIAGNOSIS — N6489 Other specified disorders of breast: Secondary | ICD-10-CM

## 2023-08-23 ENCOUNTER — Inpatient Hospital Stay (HOSPITAL_BASED_OUTPATIENT_CLINIC_OR_DEPARTMENT_OTHER): Payer: Medicare Other | Admitting: Hematology

## 2023-08-23 ENCOUNTER — Inpatient Hospital Stay: Payer: Medicare Other

## 2023-08-23 VITALS — BP 150/87 | HR 71 | Temp 98.0°F | Resp 18 | Ht 62.0 in | Wt 177.0 lb

## 2023-08-23 DIAGNOSIS — D0512 Intraductal carcinoma in situ of left breast: Secondary | ICD-10-CM | POA: Diagnosis not present

## 2023-08-23 DIAGNOSIS — Z79810 Long term (current) use of selective estrogen receptor modulators (SERMs): Secondary | ICD-10-CM | POA: Diagnosis not present

## 2023-08-23 DIAGNOSIS — M810 Age-related osteoporosis without current pathological fracture: Secondary | ICD-10-CM

## 2023-08-23 MED ORDER — DENOSUMAB 60 MG/ML ~~LOC~~ SOSY
60.0000 mg | PREFILLED_SYRINGE | Freq: Once | SUBCUTANEOUS | Status: AC
  Administered 2023-08-23: 60 mg via SUBCUTANEOUS
  Filled 2023-08-23: qty 1

## 2023-08-23 NOTE — Patient Instructions (Signed)
 Denosumab Injection (Osteoporosis) What is this medication? DENOSUMAB (den oh SUE mab) prevents and treats osteoporosis. It works by Interior and spatial designer stronger and less likely to break (fracture). It is a monoclonal antibody. This medicine may be used for other purposes; ask your health care provider or pharmacist if you have questions. COMMON BRAND NAME(S): Prolia What should I tell my care team before I take this medication? They need to know if you have any of these conditions: Dental or gum disease Had thyroid or parathyroid (glands located in neck) surgery Having dental surgery or a tooth pulled Kidney disease Low levels of calcium in the blood On dialysis Poor nutrition Thyroid disease Trouble absorbing nutrients from your food An unusual or allergic reaction to denosumab, other medications, foods, dyes, or preservatives Pregnant or trying to get pregnant Breastfeeding How should I use this medication? This medication is injected under the skin. It is given by your care team in a hospital or clinic setting. A special MedGuide will be given to you before each treatment. Be sure to read this information carefully each time. Talk to your care team about the use of this medication in children. Special care may be needed. Overdosage: If you think you have taken too much of this medicine contact a poison control center or emergency room at once. NOTE: This medicine is only for you. Do not share this medicine with others. What if I miss a dose? Keep appointments for follow-up doses. It is important not to miss your dose. Call your care team if you are unable to keep an appointment. What may interact with this medication? Do not take this medication with any of the following: Other medications that contain denosumab This medication may also interact with the following: Medications that lower your chance of fighting infection Steroid medications, such as prednisone or cortisone This  list may not describe all possible interactions. Give your health care provider a list of all the medicines, herbs, non-prescription drugs, or dietary supplements you use. Also tell them if you smoke, drink alcohol, or use illegal drugs. Some items may interact with your medicine. What should I watch for while using this medication? Your condition will be monitored carefully while you are receiving this medication. You may need blood work done while taking this medication. This medication may increase your risk of getting an infection. Call your care team for advice if you get a fever, chills, sore throat, or other symptoms of a cold or flu. Do not treat yourself. Try to avoid being around people who are sick. Tell your dentist and dental surgeon that you are taking this medication. You should not have major dental surgery while on this medication. See your dentist to have a dental exam and fix any dental problems before starting this medication. Take good care of your teeth while on this medication. Make sure you see your dentist for regular follow-up appointments. This medication may cause low levels of calcium in your body. The risk of severe side effects is increased in people with kidney disease. Your care team may prescribe calcium and vitamin D to help prevent low calcium levels while you take this medication. It is important to take calcium and vitamin D as directed by your care team. Talk to your care team if you may be pregnant. Serious birth defects may occur if you take this medication during pregnancy and for 5 months after the last dose. You will need a negative pregnancy test before starting this medication. Contraception  is recommended while taking this medication and for 5 months after the last dose. Your care team can help you find the option that works for you. Talk to your care team before breastfeeding. Changes to your treatment plan may be needed. What side effects may I notice from  receiving this medication? Side effects that you should report to your care team as soon as possible: Allergic reactions--skin rash, itching, hives, swelling of the face, lips, tongue, or throat Infection--fever, chills, cough, sore throat, wounds that don't heal, pain or trouble when passing urine, general feeling of discomfort or being unwell Low calcium level--muscle pain or cramps, confusion, tingling, or numbness in the hands or feet Osteonecrosis of the jaw--pain, swelling, or redness in the mouth, numbness of the jaw, poor healing after dental work, unusual discharge from the mouth, visible bones in the mouth Severe bone, joint, or muscle pain Skin infection--skin redness, swelling, warmth, or pain Side effects that usually do not require medical attention (report these to your care team if they continue or are bothersome): Back pain Headache Joint pain Muscle pain Pain in the hands, arms, legs, or feet Runny or stuffy nose Sore throat This list may not describe all possible side effects. Call your doctor for medical advice about side effects. You may report side effects to FDA at 1-800-FDA-1088. Where should I keep my medication? This medication is given in a hospital or clinic. It will not be stored at home. NOTE: This sheet is a summary. It may not cover all possible information. If you have questions about this medicine, talk to your doctor, pharmacist, or health care provider.  2024 Elsevier/Gold Standard (2022-03-08 00:00:00)

## 2023-08-23 NOTE — Patient Instructions (Addendum)

## 2023-08-23 NOTE — Progress Notes (Signed)
 [facility] [address], [place] [address]    Clinic Day:  08/23/2023  Referring physician: [name], MD  Patient Care Team: [name], MD as PCP - General (Internal Medicine) [name], MD as Medical Oncologist (Medical Oncology) [name], RN as Oncology Nurse Navigator (Medical Oncology)   ASSESSMENT & PLAN:   Assessment: 1.  High-grade DCIS of the left breast: - Bilateral diagnostic mammogram (10/27/2021): 0.7 cm group of calcifications in the upper central left breast. - Left breast core biopsy 12:00 (11/10/2021): High-grade DCIS, solid type with necrosis.  Negative for invasive carcinoma.  ER 40%, PR 5% strong staining. - Left breast lumpectomy (12/17/2021): Grade 3 DCIS with central comedonecrosis and microcalcifications, 1.4 cm.  Margins negative.  Distance from DCIS to closest margin 4 mm.  pTis.  ER 40%, PR 5% - Anastrozole  started on 01/18/2022.  Anastrozole  discontinued due to joint pains.  Tamoxifen  started on 05/31/2022.  Tamoxifen  dose reduced to 10 mg daily in January 2025 for better tolerability (joint pains). - She met with Dr. Dannielle of radiation oncology.  As her prognosis was excellent, she was recommended against radiation.   2.  Social/family history: - She lives at home by herself.  She is independent of ADLs and IADLs.  She is accompanied by her daughter today.  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  She is a never smoker. - Sister had breast cancer in her late 97s.  Paternal aunt died of breast cancer.    Plan: 1.  High-grade DCIS of the left breast: - She is tolerating tamoxifen  10 mg daily reasonably well. - She continues to have slight worsening of arthralgias mostly in the back, shoulders.  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 - I reviewed her mammogram of the left breast from 05/02/2023: Asymmetry in the medial left breast is stable to  slightly smaller when compared to the prior exam. - She will have another mammogram in 6 months and if everything is stable, she will be switched to once a year. - I reviewed her labs from 08/16/2023: LFTs, creatinine and CBC were normal. - She has gained about 9 pounds since the start of antiestrogen therapy and 6 pounds since January of this year.  Will closely monitor. - Recommend follow-up in 6 months.   2.  Osteoporosis (DEXA 04/18/2022 T-3.8): - She received first dose of Prolia  in June 2024.  She is taking calcium and vitamin D  supplements.  Calcium level today is 9.3 with albumin 3.5.  Denies any jaw pains or dental issues.  She may proceed with Prolia  today.  Will plan on repeating DEXA scan in 3 years from last.  Vitamin D  level is normal at 32.    No orders of the defined types were placed in this encounter.     [name],acting as a Neurosurgeon for Hai Rogers, MD.,have documented all relevant documentation on the behalf of Hai Rogers, MD,as directed by  Hai Rogers, MD while in the presence of Hai Rogers, MD.  I, Hai Rogers MD, have reviewed the above documentation for accuracy and completeness, and I agree with the above.     Hai Rogers, MD   7/9/20253:21 PM  CHIEF COMPLAINT:   Diagnosis: DCIS of the left breast    Cancer Staging  No matching staging information was found for the patient.    Prior Therapy: left breast lumpectomy  12/17/2021   Current Therapy: Tamoxifen    HISTORY OF PRESENT ILLNESS:   Oncology History  Ductal carcinoma in situ (DCIS) of left breast  11/24/2021 Initial Diagnosis   Ductal carcinoma in situ (DCIS) of left breast    Genetic Testing   Negative genetic testing. No pathogenic variants identified on the [facility] Common Hereditary Cancers+RNA panel. The report date is 03/06/2022.  The Common Hereditary Cancers Panel + RNA offered by [facility] includes sequencing and/or deletion  duplication testing of the following 48 genes: APC*, ATM*, AXIN2, BAP1, BARD1, BMPR1A, BRCA1, BRCA2, BRIP1, CDH1, CDK4, CDKN2A (p14ARF), CDKN2A (p16INK4a), CHEK2, CTNNA1, DICER1*, EPCAM*, FH*, GREM1*, HOXB13, KIT, MBD4, MEN1*, MLH1*, MSH2*, MSH3*, MSH6*, MUTYH, NF1*, NTHL1, PALB2, PDGFRA, PMS2*, POLD1*, POLE, PTEN*, RAD51C, RAD51D, SDHA*, SDHB, SDHC*, SDHD, SMAD4, SMARCA4, STK11, TP53, TSC1*, TSC2, VHL.       INTERVAL HISTORY:   Sherry Smith is a 83 y.o. female presenting to clinic today for follow up of DCIS of the left breast. She was last seen by me on 02/22/23.  Since her last visit, she underwent left mammogram on 05/02/23 that found: Stable to likely slight decrease in size of asymmetry in the medial left breast likely related to evolving postsurgical change.  Today, she states that she is doing well overall. Her appetite level is at 100%. Her energy level is at 20%.  PAST MEDICAL HISTORY:   Past Medical History: Past Medical History:  Diagnosis Date   Arthritis    Atrial fibrillation (HCC)    Chronic headaches 11/29/2016   Dysrhythmia     Surgical History: Past Surgical History:  Procedure Laterality Date   BREAST BIOPSY Left 11/10/2021   HIGH-GRADE DUCTAL CARCINOMA IN SITU, SOLID TYPE WITH NECROSIS, MICROCALCIFICATIONS PRESENT WITHIN DCIS   BREAST BIOPSY Right    benign   BREAST LUMPECTOMY Left 12/17/2021   Ductal carcinoma in situ (DCIS), grade III (high), with central  comedo necrosis and microcalcifications., at least 14 mm in greatest  dimension.  -   All margins negative for DCIS.   CATARACT EXTRACTION, BILATERAL     Ruptured ovary     left ovary in the 60s    Social History: Social History   Socioeconomic History   Marital status: Widowed    Spouse name: Not on file   Number of children: Not on file   Years of education: Not on file   Highest education level: Not on file  Occupational History   Not on file  Tobacco Use   Smoking status: Never   Smokeless tobacco:  Never  Substance and Sexual Activity   Alcohol  use: No   Drug use: No   Sexual activity: Not on file  Other Topics Concern   Not on file  Social History Narrative   Not on file   Social Drivers of Health   Financial Resource Strain: Low Risk  (01/29/2023)   Received from [facility]   Overall Financial Resource Strain (CARDIA)    Difficulty of Paying Living Expenses: Not hard at all  Food Insecurity: No Food Insecurity (01/29/2023)   Received from [facility]   Hunger Vital Sign    Within the past 12 months, you worried that your food would run out before you got the money to buy more.: Never true    Within the past 12 months, the food you bought just didn't last and you didn't have money to get more.: Never true  Transportation Needs: No Transportation Needs (01/29/2023)   [facility]   PRAPARE - Transportation    Lack of Transportation (Medical): No    Lack of Transportation (Non-Medical): No  Physical Activity: Insufficiently Active (01/29/2023)   Received from [facility]   Exercise Vital Sign    On average, how many days per week do you engage in moderate to strenuous exercise (like a brisk walk)?: 7 days    On average, how many minutes do you engage in exercise at this level?: 20 min  Stress: No Stress Concern Present (01/29/2023)   Received from [facility] - Occupational Stress Questionnaire    Feeling of Stress : Not at all  Social Connections: Unknown (01/29/2023)   Received from [facility]   Social Connection and Isolation Panel    In a typical week, how many times do you talk on the phone with family, friends, or neighbors?: More than three times a week    How often do you get together with friends or relatives?: More than three times a week    How often do you attend church or religious services?: More than 4 times per year    Do you belong to any clubs or organizations such as  church groups, unions, fraternal or athletic groups, or school groups?: Yes    How often do you attend meetings of the clubs or organizations you belong to?: More than 4 times per year    Marital Status: Not on file  Intimate Partner Violence: Not At Risk (01/29/2023)   Received from [facility]   Humiliation, Afraid, Rape, and Kick questionnaire    Within the last year, have you been afraid of your partner or ex-partner?: No    Within the last year, have you been humiliated or emotionally abused in other ways by your partner or ex-partner?: No    Within the last year, have you been kicked, hit, slapped, or otherwise physically hurt by your partner or ex-partner?: No    Within the last year, have you been raped or forced to have any kind of sexual activity by your partner or ex-partner?: No    Family History: Family History  Problem Relation Age of Onset   Breast cancer Sister 38 - 36   Colon cancer Maternal Aunt 50 - 60   Brain cancer Maternal Uncle 39 - 80   Breast cancer Paternal Aunt 75 - 45       recurrence, d. 85s    Current Medications:  Current Outpatient Medications:    acetaminophen (TYLENOL) 325 MG tablet, Take 650 mg by mouth every 6 (six) hours as needed for moderate pain., Disp: , Rfl:    amoxicillin -clavulanate (AUGMENTIN ) 875-125 MG tablet, Take 1 tablet by mouth at bedtime., Disp: 30 tablet, Rfl: 11   amoxicillin -clavulanate (AUGMENTIN ) 875-125 MG tablet, TAKE 1 TABLET BY MOUTH TWICE DAILY FOR 7 DAYS, Disp: 14 tablet, Rfl: 0   Calcium Carb-Cholecalciferol (CALCIUM + VITAMIN D3 PO), Take 1 tablet by mouth daily., Disp: , Rfl:    ELIQUIS 5 MG TABS tablet, Take 5 mg by mouth 2 (two) times daily., Disp: , Rfl:    estradiol (ESTRACE) 0.1 MG/GM vaginal cream, Apply green pea sized amount to opening of vagina 3 times a week., Disp: , Rfl:    latanoprost (XALATAN) 0.005 % ophthalmic solution, Place 1 drop into both eyes at bedtime., Disp: , Rfl:    metoprolol tartrate  (LOPRESSOR) 25 MG tablet, Take 12.5 mg by mouth  2 (two) times daily. (Patient taking differently: Take 12.5 mg by mouth daily. Takes at night daily), Disp: , Rfl:    pantoprazole (PROTONIX) 40 MG tablet, Take 40 mg by mouth daily., Disp: , Rfl:    rosuvastatin (CRESTOR) 5 MG tablet, Take 5 mg by mouth once a week., Disp: , Rfl:    tamoxifen  (NOLVADEX ) 20 MG tablet, TAKE 1 TABLET BY MOUTH DAILY, Disp: 30 tablet, Rfl: 3   Allergies: Allergies  Allergen Reactions   Macrodantin [Nitrofurantoin] Rash   Sulfa Antibiotics     Headaches.   Tetracyclines & Related     Yeast infection    REVIEW OF SYSTEMS:   Review of Systems  Constitutional:  Negative for chills, fatigue and fever.  HENT:   Negative for lump/mass, mouth sores, nosebleeds, sore throat and trouble swallowing.   Eyes:  Negative for eye problems.  Respiratory:  Positive for shortness of breath. Negative for cough.   Cardiovascular:  Negative for chest pain, leg swelling and palpitations.  Gastrointestinal:  Negative for abdominal pain, constipation, diarrhea, nausea and vomiting.  Genitourinary:  Negative for bladder incontinence, difficulty urinating, dysuria, frequency, hematuria and nocturia.   Musculoskeletal:  Positive for arthralgias. Negative for back pain, flank pain, myalgias and neck pain.  Skin:  Negative for itching and rash.  Neurological:  Positive for dizziness. Negative for headaches and numbness.  Hematological:  Does not bruise/bleed easily.  Psychiatric/Behavioral:  Negative for depression, sleep disturbance and suicidal ideas. The patient is not nervous/anxious.   All other systems reviewed and are negative.    VITALS:   Blood pressure (!) 150/87, pulse 71, temperature 98 F (36.7 C), temperature source Tympanic, resp. rate 18, height 5' 2 (1.575 m), weight 177 lb (80.3 kg), SpO2 96%.  Wt Readings from Last 3 Encounters:  08/23/23 177 lb (80.3 kg)  02/22/23 171 lb 8 oz (77.8 kg)  08/02/22 168 lb 14.4  oz (76.6 kg)    Body mass index is 32.37 kg/m.  Performance status (ECOG): 1 - Symptomatic but completely ambulatory  PHYSICAL EXAM:   Physical Exam Vitals and nursing note reviewed. Exam conducted with a chaperone present.  Constitutional:      Appearance: Normal appearance.  Cardiovascular:     Rate and Rhythm: Normal rate and regular rhythm.     Pulses: Normal pulses.     Heart sounds: Normal heart sounds.  Pulmonary:     Effort: Pulmonary effort is normal.     Breath sounds: Normal breath sounds.  Abdominal:     Palpations: Abdomen is soft. There is no hepatomegaly, splenomegaly or mass.     Tenderness: There is no abdominal tenderness.  Musculoskeletal:     Right lower leg: No edema.     Left lower leg: No edema.  Lymphadenopathy:     Cervical: No cervical adenopathy.     Right cervical: No superficial, deep or posterior cervical adenopathy.    Left cervical: No superficial, deep or posterior cervical adenopathy.     Upper Body:     Right upper body: No supraclavicular or axillary adenopathy.     Left upper body: No supraclavicular or axillary adenopathy.  Neurological:     General: No focal deficit present.     Mental Status: She is alert and oriented to person, place, and time.  Psychiatric:        Mood and Affect: Mood normal.        Behavior: Behavior normal.     LABS:  Latest Ref Rng & Units 08/16/2023    1:28 PM 02/22/2023    2:20 PM 04/18/2022    1:31 PM  CBC  WBC 4.0 - 10.5 K/uL 4.5  5.9  4.7   Hemoglobin 12.0 - 15.0 g/dL 87.8  86.8  87.5   Hematocrit 36.0 - 46.0 % 39.1  39.7  37.9   Platelets 150 - 400 K/uL 219  235  262       Latest Ref Rng & Units 08/16/2023    1:28 PM 02/22/2023    2:20 PM 08/02/2022    2:22 PM  CMP  Glucose 70 - 99 mg/dL 879  99  888   BUN 8 - 23 mg/dL 14  14  15    Creatinine 0.44 - 1.00 mg/dL 9.15  8.98  9.14   Sodium 135 - 145 mmol/L 142  136  135   Potassium 3.5 - 5.1 mmol/L 4.2  4.1  4.1   Chloride 98 - 111 mmol/L 104   101  102   CO2 22 - 32 mmol/L 25  26  21    Calcium 8.9 - 10.3 mg/dL 9.3  9.3  8.8   Total Protein 6.5 - 8.1 g/dL 6.8  7.3  7.3   Total Bilirubin 0.0 - 1.2 mg/dL 0.3  0.2  0.4   Alkaline Phos 38 - 126 U/L 35  36  44   AST 15 - 41 U/L 17  21  23    ALT 0 - 44 U/L 9  13  16       No results found for: CEA1, CEA / No results found for: CEA1, CEA No results found for: PSA1 No results found for: CAN199 No results found for: CAN125  No results found for: TOTALPROTELP, ALBUMINELP, A1GS, A2GS, BETS, BETA2SER, GAMS, MSPIKE, SPEI No results found for: TIBC, FERRITIN, IRONPCTSAT No results found for: LDH   STUDIES:   No results found.

## 2023-08-23 NOTE — Progress Notes (Signed)
 Labs reviewed with MD and treatment team. Per pt she is taking vitamin D  and Calcium supplements at home.  Patient tolerated injection with no complaints voiced.  Site clean and dry with no bruising or swelling noted at site.  See MAR for details.  Band aid applied.  Patient stable during and after injection.  Vss with discharge and left in satisfactory condition with no s/s of distress noted. All follow ups as scheduled.   Docie Abramovich

## 2023-08-29 DIAGNOSIS — I480 Paroxysmal atrial fibrillation: Secondary | ICD-10-CM | POA: Diagnosis not present

## 2023-08-29 DIAGNOSIS — I10 Essential (primary) hypertension: Secondary | ICD-10-CM | POA: Diagnosis not present

## 2023-08-29 DIAGNOSIS — I5030 Unspecified diastolic (congestive) heart failure: Secondary | ICD-10-CM | POA: Diagnosis not present

## 2023-08-31 DIAGNOSIS — Z79899 Other long term (current) drug therapy: Secondary | ICD-10-CM | POA: Diagnosis not present

## 2023-08-31 DIAGNOSIS — R5383 Other fatigue: Secondary | ICD-10-CM | POA: Diagnosis not present

## 2023-08-31 DIAGNOSIS — Z299 Encounter for prophylactic measures, unspecified: Secondary | ICD-10-CM | POA: Diagnosis not present

## 2023-08-31 DIAGNOSIS — Z6830 Body mass index (BMI) 30.0-30.9, adult: Secondary | ICD-10-CM | POA: Diagnosis not present

## 2023-08-31 DIAGNOSIS — Z1331 Encounter for screening for depression: Secondary | ICD-10-CM | POA: Diagnosis not present

## 2023-08-31 DIAGNOSIS — Z0000 Encounter for general adult medical examination without abnormal findings: Secondary | ICD-10-CM | POA: Diagnosis not present

## 2023-08-31 DIAGNOSIS — E7800 Pure hypercholesterolemia, unspecified: Secondary | ICD-10-CM | POA: Diagnosis not present

## 2023-08-31 DIAGNOSIS — Z7189 Other specified counseling: Secondary | ICD-10-CM | POA: Diagnosis not present

## 2023-08-31 DIAGNOSIS — Z1339 Encounter for screening examination for other mental health and behavioral disorders: Secondary | ICD-10-CM | POA: Diagnosis not present

## 2023-08-31 DIAGNOSIS — I10 Essential (primary) hypertension: Secondary | ICD-10-CM | POA: Diagnosis not present

## 2023-09-07 DIAGNOSIS — M25562 Pain in left knee: Secondary | ICD-10-CM | POA: Diagnosis not present

## 2023-09-07 DIAGNOSIS — I10 Essential (primary) hypertension: Secondary | ICD-10-CM | POA: Diagnosis not present

## 2023-09-07 DIAGNOSIS — Z299 Encounter for prophylactic measures, unspecified: Secondary | ICD-10-CM | POA: Diagnosis not present

## 2023-09-12 DIAGNOSIS — H04123 Dry eye syndrome of bilateral lacrimal glands: Secondary | ICD-10-CM | POA: Diagnosis not present

## 2023-09-12 DIAGNOSIS — H401432 Capsular glaucoma with pseudoexfoliation of lens, bilateral, moderate stage: Secondary | ICD-10-CM | POA: Diagnosis not present

## 2023-09-12 DIAGNOSIS — H16223 Keratoconjunctivitis sicca, not specified as Sjogren's, bilateral: Secondary | ICD-10-CM | POA: Diagnosis not present

## 2023-09-27 DIAGNOSIS — Z299 Encounter for prophylactic measures, unspecified: Secondary | ICD-10-CM | POA: Diagnosis not present

## 2023-09-27 DIAGNOSIS — I10 Essential (primary) hypertension: Secondary | ICD-10-CM | POA: Diagnosis not present

## 2023-09-27 DIAGNOSIS — M25561 Pain in right knee: Secondary | ICD-10-CM | POA: Diagnosis not present

## 2023-10-26 ENCOUNTER — Other Ambulatory Visit (HOSPITAL_COMMUNITY): Payer: Self-pay | Admitting: Oncology

## 2023-10-26 DIAGNOSIS — N6489 Other specified disorders of breast: Secondary | ICD-10-CM

## 2023-11-07 ENCOUNTER — Ambulatory Visit (HOSPITAL_COMMUNITY)
Admission: RE | Admit: 2023-11-07 | Discharge: 2023-11-07 | Disposition: A | Discharge status: Home / Self Care | Source: Ambulatory Visit | Attending: Hematology | Admitting: Hematology

## 2023-11-07 ENCOUNTER — Encounter (HOSPITAL_COMMUNITY): Payer: Self-pay

## 2023-11-07 DIAGNOSIS — N6489 Other specified disorders of breast: Secondary | ICD-10-CM | POA: Diagnosis not present

## 2023-11-07 DIAGNOSIS — R92333 Mammographic heterogeneous density, bilateral breasts: Secondary | ICD-10-CM | POA: Diagnosis not present

## 2023-11-08 ENCOUNTER — Inpatient Hospital Stay
Admission: RE | Admit: 2023-11-08 | Discharge: 2023-11-08 | Disposition: A | Discharge status: Home / Self Care | Payer: Self-pay | Source: Ambulatory Visit | Attending: Internal Medicine | Admitting: Internal Medicine

## 2023-11-08 ENCOUNTER — Other Ambulatory Visit (HOSPITAL_COMMUNITY): Payer: Self-pay | Admitting: Internal Medicine

## 2023-11-08 ENCOUNTER — Inpatient Hospital Stay
Admission: RE | Admit: 2023-11-08 | Discharge: 2023-11-08 | Disposition: A | Discharge status: Home / Self Care | Payer: Self-pay | Source: Ambulatory Visit | Attending: Oncology | Admitting: Oncology

## 2023-11-08 ENCOUNTER — Other Ambulatory Visit (HOSPITAL_COMMUNITY): Payer: Self-pay | Admitting: Oncology

## 2023-11-08 DIAGNOSIS — Z1231 Encounter for screening mammogram for malignant neoplasm of breast: Secondary | ICD-10-CM

## 2023-11-28 DIAGNOSIS — I480 Paroxysmal atrial fibrillation: Secondary | ICD-10-CM | POA: Diagnosis not present

## 2023-11-28 DIAGNOSIS — I10 Essential (primary) hypertension: Secondary | ICD-10-CM | POA: Diagnosis not present

## 2023-11-28 DIAGNOSIS — I5030 Unspecified diastolic (congestive) heart failure: Secondary | ICD-10-CM | POA: Diagnosis not present

## 2023-11-29 DIAGNOSIS — Z23 Encounter for immunization: Secondary | ICD-10-CM | POA: Diagnosis not present

## 2023-12-25 ENCOUNTER — Other Ambulatory Visit: Payer: Self-pay | Admitting: *Deleted

## 2023-12-25 DIAGNOSIS — D0512 Intraductal carcinoma in situ of left breast: Secondary | ICD-10-CM

## 2023-12-25 MED ORDER — TAMOXIFEN CITRATE 20 MG PO TABS: 20.0000 mg | ORAL_TABLET | Freq: Every day | ORAL | 3 refills | Status: AC

## 2023-12-25 NOTE — Telephone Encounter (Signed)
 Tolerating Tamoxifen  and is to continue therapy per ovn.

## 2024-02-20 DIAGNOSIS — D0512 Intraductal carcinoma in situ of left breast: Secondary | ICD-10-CM

## 2024-02-20 DIAGNOSIS — E559 Vitamin D deficiency, unspecified: Secondary | ICD-10-CM

## 2024-02-21 ENCOUNTER — Inpatient Hospital Stay: Attending: Hematology

## 2024-02-21 DIAGNOSIS — M810 Age-related osteoporosis without current pathological fracture: Secondary | ICD-10-CM | POA: Insufficient documentation

## 2024-02-21 DIAGNOSIS — Z803 Family history of malignant neoplasm of breast: Secondary | ICD-10-CM | POA: Diagnosis not present

## 2024-02-21 DIAGNOSIS — D0512 Intraductal carcinoma in situ of left breast: Secondary | ICD-10-CM | POA: Insufficient documentation

## 2024-02-21 DIAGNOSIS — E559 Vitamin D deficiency, unspecified: Secondary | ICD-10-CM | POA: Diagnosis not present

## 2024-02-21 DIAGNOSIS — Z79810 Long term (current) use of selective estrogen receptor modulators (SERMs): Secondary | ICD-10-CM | POA: Insufficient documentation

## 2024-02-21 LAB — COMPREHENSIVE METABOLIC PANEL WITH GFR
ALT: 11 U/L (ref 0–44)
AST: 21 U/L (ref 15–41)
Albumin: 4.2 g/dL (ref 3.5–5.0)
Alkaline Phosphatase: 39 U/L (ref 38–126)
Anion gap: 16 — ABNORMAL HIGH (ref 5–15)
BUN: 13 mg/dL (ref 8–23)
CO2: 21 mmol/L — ABNORMAL LOW (ref 22–32)
Calcium: 9.3 mg/dL (ref 8.9–10.3)
Chloride: 103 mmol/L (ref 98–111)
Creatinine, Ser: 0.85 mg/dL (ref 0.44–1.00)
GFR, Estimated: >60 mL/min
Glucose, Bld: 104 mg/dL — ABNORMAL HIGH (ref 70–99)
Potassium: 4.7 mmol/L (ref 3.5–5.1)
Sodium: 141 mmol/L (ref 135–145)
Total Bilirubin: 0.4 mg/dL (ref 0.0–1.2)
Total Protein: 7.1 g/dL (ref 6.5–8.1)

## 2024-02-21 LAB — CBC WITH DIFFERENTIAL/PLATELET
Abs Immature Granulocytes: 0.03 K/uL (ref 0.00–0.07)
Basophils Absolute: 0 K/uL (ref 0.0–0.1)
Basophils Relative: 1 %
Eosinophils Absolute: 0 K/uL (ref 0.0–0.5)
Eosinophils Relative: 1 %
HCT: 41.9 % (ref 36.0–46.0)
Hemoglobin: 13.4 g/dL (ref 12.0–15.0)
Immature Granulocytes: 1 %
Lymphocytes Relative: 30 %
Lymphs Abs: 2 K/uL (ref 0.7–4.0)
MCH: 32.1 pg (ref 26.0–34.0)
MCHC: 32 g/dL (ref 30.0–36.0)
MCV: 100.5 fL — ABNORMAL HIGH (ref 80.0–100.0)
Monocytes Absolute: 0.7 K/uL (ref 0.1–1.0)
Monocytes Relative: 11 %
Neutro Abs: 3.8 K/uL (ref 1.7–7.7)
Neutrophils Relative %: 56 %
Platelets: 269 K/uL (ref 150–400)
RBC: 4.17 MIL/uL (ref 3.87–5.11)
RDW: 11.7 % (ref 11.5–15.5)
WBC: 6.6 K/uL (ref 4.0–10.5)
nRBC: 0 % (ref 0.0–0.2)

## 2024-02-21 LAB — VITAMIN D 25 HYDROXY (VIT D DEFICIENCY, FRACTURES): Vit D, 25-Hydroxy: 35.3 ng/mL (ref 30–100)

## 2024-02-28 ENCOUNTER — Inpatient Hospital Stay

## 2024-02-28 ENCOUNTER — Inpatient Hospital Stay (HOSPITAL_BASED_OUTPATIENT_CLINIC_OR_DEPARTMENT_OTHER): Admitting: Oncology

## 2024-02-28 VITALS — BP 139/83 | HR 73 | Temp 98.0°F | Resp 16 | Wt 176.6 lb

## 2024-02-28 DIAGNOSIS — M810 Age-related osteoporosis without current pathological fracture: Secondary | ICD-10-CM | POA: Diagnosis not present

## 2024-02-28 DIAGNOSIS — M818 Other osteoporosis without current pathological fracture: Secondary | ICD-10-CM

## 2024-02-28 DIAGNOSIS — E559 Vitamin D deficiency, unspecified: Secondary | ICD-10-CM

## 2024-02-28 DIAGNOSIS — D0512 Intraductal carcinoma in situ of left breast: Secondary | ICD-10-CM | POA: Diagnosis not present

## 2024-02-28 MED ORDER — DENOSUMAB 60 MG/ML ~~LOC~~ SOSY
60.0000 mg | PREFILLED_SYRINGE | Freq: Once | SUBCUTANEOUS | Status: AC
  Administered 2024-02-28: 60 mg via SUBCUTANEOUS
  Filled 2024-02-28: qty 1

## 2024-02-28 NOTE — Progress Notes (Signed)
Patient taking calcium as directed. Denied tooth, jaw, and leg pain. No recent or upcoming dental visits. Labs reviewed. Patient tolerated injection with no complaints voiced. See MAR for details. Patient stable during and after injection. Site clean and dry with no bruising or swelling noted. Band aid applied. Vss with discharge and left in satisfactory condition with no s/s of distress.  

## 2024-02-28 NOTE — Progress Notes (Signed)
 "  [facility] OFFICE PROGRESS NOTE  [name], MD  ASSESSMENT & PLAN:    Assessment & Plan Ductal carcinoma in situ (DCIS) of left breast - She is tolerating tamoxifen  10 mg daily reasonably well. - She continues to have slight worsening of arthralgias mostly in the back, shoulders.  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 - Reviewed mammograms from 05/02/2023 with repeat on 11/08/2023 which were essentially unremarkable.  She will need a repeat mammogram in September 2026.  - I reviewed her labs from 02/21/2024: LFTs, creatinine and CBC were normal. - Recommend follow-up in 6 months. Other osteoporosis without current pathological fracture - She received first dose of Prolia  in June 2024.  She is taking calcium and vitamin D  supplements.  Calcium level today is 9.3 with albumin 4.2.  Denies any jaw pains or dental issues.  She may proceed with Prolia  today.  Will plan on repeating DEXA scan in 3 years from last.  Vitamin D  level is normal at 35.3. Vitamin D  deficiency -Continue vitamin D  supplements.  Orders Placed This Encounter  Procedures   CBC with Differential    Standing Status:   Future    Expected Date:   05/28/2024    Expiration Date:   08/26/2024   Comprehensive metabolic panel    Standing Status:   Future    Expected Date:   05/28/2024    Expiration Date:   08/26/2024   Vitamin D  25 hydroxy    Standing Status:   Future    Expected Date:   05/28/2024    Expiration Date:   08/26/2024    INTERVAL HISTORY: Patient returns for follow-up for breast cancer and osteoporosis.  She was last seen by Dr. Rogers on 08/23/2023.  She denies any interval hospitalizations, surgeries or changes to baseline health.  Overall, patient is doing well.  Mammogram from 05/02/2023 showed asymmetry in the medial left breast and recommended short-term follow-up in 6 months.  She had a repeat mammogram on 11/07/2023 which was BI-RADS Category 3 probably benign findings.   Bilateral diagnostic mammogram in 1 year which will be September 2026.  She continues to have pain in her back and shoulders.  Reports that might be slightly worse since being on tamoxifen  but overall stable.  Appetite is 100% energy levels are 50%.  She has shortness of breath with exertion at times.  Has urinary incontinence, dizziness, headaches, numbness and burning and insomnia.  She is currently taking vitamin calcium OTC along with Tylenol for her back.  We reviewed CBC, CMP, vitamin D .  SUMMARY OF HEMATOLOGIC HISTORY: Oncology History  Ductal carcinoma in situ (DCIS) of left breast  11/24/2021 Initial Diagnosis   Ductal carcinoma in situ (DCIS) of left breast    Genetic Testing   Negative genetic testing. No pathogenic variants identified on the [facility] Common Hereditary Cancers+RNA panel. The report date is 03/06/2022.  The Common Hereditary Cancers Panel + RNA offered by [facility] includes sequencing and/or deletion duplication testing of the following 48 genes: APC*, ATM*, AXIN2, BAP1, BARD1, BMPR1A, BRCA1, BRCA2, BRIP1, CDH1, CDK4, CDKN2A (p14ARF), CDKN2A (p16INK4a), CHEK2, CTNNA1, DICER1*, EPCAM*, FH*, GREM1*, HOXB13, KIT, MBD4, MEN1*, MLH1*, MSH2*, MSH3*, MSH6*, MUTYH, NF1*, NTHL1, PALB2, PDGFRA, PMS2*, POLD1*, POLE, PTEN*, RAD51C, RAD51D, SDHA*, SDHB, SDHC*, SDHD, SMAD4, SMARCA4, STK11, TP53, TSC1*, TSC2, VHL.      1.  High-grade DCIS of the left breast: - Bilateral diagnostic mammogram (10/27/2021): 0.7 cm group of calcifications in the upper central  left breast. - Left breast core biopsy 12:00 (11/10/2021): High-grade DCIS, solid type with necrosis.  Negative for invasive carcinoma.  ER 40%, PR 5% strong staining. - Left breast lumpectomy (12/17/2021): Grade 3 DCIS with central comedonecrosis and microcalcifications, 1.4 cm.  Margins negative.  Distance from DCIS to closest margin 4 mm.  pTis.  ER 40%, PR 5% - Anastrozole  started on 01/18/2022.  Anastrozole  discontinued due to joint  pains.  Tamoxifen  started on 05/31/2022.  Tamoxifen  dose reduced to 10 mg daily in January 2025 for better tolerability (joint pains). - She met with Dr. Dannielle of radiation oncology.  As her prognosis was excellent, she was recommended against radiation.   2.  Social/family history: - She lives at home by herself.  She is independent of ADLs and IADLs.  She is accompanied by her daughter today.  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  She is a never smoker. - Sister had breast cancer in her late 90s.  Paternal aunt died of breast cancer. CBC    Component Value Date/Time   WBC 6.6 02/21/2024 1321   RBC 4.17 02/21/2024 1321   HGB 13.4 02/21/2024 1321   HCT 41.9 02/21/2024 1321   PLT 269 02/21/2024 1321   MCV 100.5 (H) 02/21/2024 1321   MCH 32.1 02/21/2024 1321   MCHC 32.0 02/21/2024 1321   RDW 11.7 02/21/2024 1321   LYMPHSABS 2.0 02/21/2024 1321   MONOABS 0.7 02/21/2024 1321   EOSABS 0.0 02/21/2024 1321   BASOSABS 0.0 02/21/2024 1321       Latest Ref Rng & Units 02/21/2024    1:21 PM 08/16/2023    1:28 PM 02/22/2023    2:20 PM  CMP  Glucose 70 - 99 mg/dL 895  879  99   BUN 8 - 23 mg/dL 13  14  14    Creatinine 0.44 - 1.00 mg/dL 9.14  9.15  8.98   Sodium 135 - 145 mmol/L 141  142  136   Potassium 3.5 - 5.1 mmol/L 4.7  4.2  4.1   Chloride 98 - 111 mmol/L 103  104  101   CO2 22 - 32 mmol/L 21  25  26    Calcium 8.9 - 10.3 mg/dL 9.3  9.3  9.3   Total Protein 6.5 - 8.1 g/dL 7.1  6.8  7.3   Total Bilirubin 0.0 - 1.2 mg/dL 0.4  0.3  0.2   Alkaline Phos 38 - 126 U/L 39  35  36   AST 15 - 41 U/L 21  17  21    ALT 0 - 44 U/L 11  9  13       No results found for: FERRITIN, VITAMINB12  Vitals:   02/28/24 1303  BP: 139/83  Pulse: 73  Resp: 16  Temp: 98 F (36.7 C)  SpO2: 93%    Review of System:  Review of Systems  Constitutional:  Positive for malaise/fatigue.  Respiratory:  Positive for shortness of breath.    Genitourinary:  Positive for urgency.  Musculoskeletal:  Positive for back pain, joint pain and myalgias.  Neurological:  Positive for dizziness and headaches.  Psychiatric/Behavioral:  The patient has insomnia.     Physical Exam: Physical Exam Constitutional:      Appearance: Normal appearance.  HENT:     Head: Normocephalic and atraumatic.  Eyes:     Pupils: Pupils are equal, round, and reactive to light.  Cardiovascular:     Rate  and Rhythm: Normal rate and regular rhythm.     Heart sounds: Normal heart sounds. No murmur heard. Pulmonary:     Effort: Pulmonary effort is normal.     Breath sounds: Normal breath sounds. No wheezing.  Abdominal:     General: Bowel sounds are normal. There is no distension.     Palpations: Abdomen is soft.     Tenderness: There is no abdominal tenderness.  Musculoskeletal:        General: Normal range of motion.     Cervical back: Normal range of motion.  Skin:    General: Skin is warm and dry.     Findings: No rash.  Neurological:     Mental Status: She is alert and oriented to person, place, and time.     Gait: Gait is intact.  Psychiatric:        Mood and Affect: Mood and affect normal.        Cognition and Memory: Memory normal.        Judgment: Judgment normal.      I spent 20 minutes dedicated to the care of this patient (face-to-face and non-face-to-face) on the date of the encounter to include what is described in the assessment and plan.,  Delon Hope, NP 02/28/2024 1:30 PM "

## 2024-02-28 NOTE — Assessment & Plan Note (Addendum)
-   She is tolerating tamoxifen  10 mg daily reasonably well. - She continues to have slight worsening of arthralgias mostly in the back, shoulders.  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 - Reviewed mammograms from 05/02/2023 with repeat on 11/08/2023 which were essentially unremarkable.  She will need a repeat mammogram in September 2026.  - I reviewed her labs from 02/21/2024: LFTs, creatinine and CBC were normal. - Recommend follow-up in 6 months.

## 2024-02-28 NOTE — Patient Instructions (Signed)
 [facility] [place] - A DEPT OF [facility]  Discharge Instructions: Thank you for choosing [facility] to provide your oncology and hematology care.  If you have a lab appointment with the Cancer Center - please note that after April 8th, 2024, all labs will be drawn in the cancer center.  You do not have to check in or register with the main entrance as you have in the past but will complete your check-in in the cancer center.  Wear comfortable clothing and clothing appropriate for easy access to any Portacath or PICC line.   We strive to give you quality time with your provider. You may need to reschedule your appointment if you arrive late (15 or more minutes).  Arriving late affects you and other patients whose appointments are after yours.  Also, if you miss three or more appointments without notifying the office, you may be dismissed from the clinic at the provider's discretion.      For prescription refill requests, have your pharmacy contact our office and allow 72 hours for refills to be completed.    Today you received the following Prolia, return as scheduled.   To help prevent nausea and vomiting after your treatment, we encourage you to take your nausea medication as directed.  BELOW ARE SYMPTOMS THAT SHOULD BE REPORTED IMMEDIATELY: *FEVER GREATER THAN 100.4 F (38 C) OR HIGHER *CHILLS OR SWEATING *NAUSEA AND VOMITING THAT IS NOT CONTROLLED WITH YOUR NAUSEA MEDICATION *UNUSUAL SHORTNESS OF BREATH *UNUSUAL BRUISING OR BLEEDING *URINARY PROBLEMS (pain or burning when urinating, or frequent urination) *BOWEL PROBLEMS (unusual diarrhea, constipation, pain near the anus) TENDERNESS IN MOUTH AND THROAT WITH OR WITHOUT PRESENCE OF ULCERS (sore throat, sores in mouth, or a toothache) UNUSUAL RASH, SWELLING OR PAIN  UNUSUAL VAGINAL DISCHARGE OR ITCHING   Items with * indicate a potential emergency and should be followed up as soon as possible or  go to the Emergency Department if any problems should occur.  Please show the CHEMOTHERAPY ALERT CARD or IMMUNOTHERAPY ALERT CARD at check-in to the Emergency Department and triage nurse.  Should you have questions after your visit or need to cancel or reschedule your appointment, please contact [facility] [place] - A DEPT OF [facility] [phone]  and follow the prompts.  Office hours are 8:00 a.m. to 4:30 p.m. Monday - Friday. Please note that voicemails left after 4:00 p.m. may not be returned until the following business day.  We are closed weekends and major holidays. You have access to a nurse at all times for urgent questions. Please call the main number to the clinic [phone] and follow the prompts.  For any non-urgent questions, you may also contact your provider using MyChart. We now offer e-Visits for anyone 59 and older to request care online for non-urgent symptoms. For details visit [email or web address].   Also download the MyChart app! Go to the app store, search "MyChart", open the app, select [place], and log in with your MyChart username and password.

## 2024-02-28 NOTE — Assessment & Plan Note (Addendum)
-   She received first dose of Prolia  in June 2024.  She is taking calcium and vitamin D  supplements.  Calcium level today is 9.3 with albumin 4.2.  Denies any jaw pains or dental issues.  She may proceed with Prolia  today.  Will plan on repeating DEXA scan in 3 years from last.  Vitamin D  level is normal at 35.3.

## 2024-04-17 ENCOUNTER — Ambulatory Visit: Payer: Medicare Other | Admitting: Urology

## 2024-08-21 ENCOUNTER — Inpatient Hospital Stay

## 2024-08-28 ENCOUNTER — Inpatient Hospital Stay

## 2024-08-28 ENCOUNTER — Inpatient Hospital Stay: Admitting: Oncology
